# Patient Record
Sex: Male | Born: 1955
Health system: Southern US, Community
[De-identification: ages and names within clinical notes are randomized; demographics above are authoritative.]

## PROBLEM LIST (undated history)

## (undated) DIAGNOSIS — J449 Chronic obstructive pulmonary disease, unspecified: Secondary | ICD-10-CM

---

## 2017-02-13 DIAGNOSIS — E785 Hyperlipidemia, unspecified: Secondary | ICD-10-CM | POA: Diagnosis not present

## 2017-02-13 DIAGNOSIS — Z0000 Encounter for general adult medical examination without abnormal findings: Secondary | ICD-10-CM | POA: Diagnosis not present

## 2017-02-21 DIAGNOSIS — H2513 Age-related nuclear cataract, bilateral: Secondary | ICD-10-CM | POA: Diagnosis not present

## 2017-02-21 DIAGNOSIS — H353112 Nonexudative age-related macular degeneration, right eye, intermediate dry stage: Secondary | ICD-10-CM | POA: Diagnosis not present

## 2017-02-21 DIAGNOSIS — H353123 Nonexudative age-related macular degeneration, left eye, advanced atrophic without subfoveal involvement: Secondary | ICD-10-CM | POA: Diagnosis not present

## 2018-02-19 DIAGNOSIS — Z0000 Encounter for general adult medical examination without abnormal findings: Secondary | ICD-10-CM | POA: Diagnosis not present

## 2018-02-19 DIAGNOSIS — E785 Hyperlipidemia, unspecified: Secondary | ICD-10-CM | POA: Diagnosis not present

## 2018-03-15 DIAGNOSIS — H353112 Nonexudative age-related macular degeneration, right eye, intermediate dry stage: Secondary | ICD-10-CM | POA: Diagnosis not present

## 2018-03-15 DIAGNOSIS — H353123 Nonexudative age-related macular degeneration, left eye, advanced atrophic without subfoveal involvement: Secondary | ICD-10-CM | POA: Diagnosis not present

## 2018-07-15 DIAGNOSIS — H9072 Mixed conductive and sensorineural hearing loss, unilateral, left ear, with unrestricted hearing on the contralateral side: Secondary | ICD-10-CM | POA: Diagnosis not present

## 2018-07-15 DIAGNOSIS — H65492 Other chronic nonsuppurative otitis media, left ear: Secondary | ICD-10-CM | POA: Diagnosis not present

## 2018-07-15 DIAGNOSIS — H90A32 Mixed conductive and sensorineural hearing loss, unilateral, left ear with restricted hearing on the contralateral side: Secondary | ICD-10-CM | POA: Diagnosis not present

## 2018-07-15 DIAGNOSIS — H9191 Unspecified hearing loss, right ear: Secondary | ICD-10-CM | POA: Diagnosis not present

## 2018-07-15 DIAGNOSIS — H9312 Tinnitus, left ear: Secondary | ICD-10-CM | POA: Diagnosis not present

## 2018-07-22 DIAGNOSIS — Z87891 Personal history of nicotine dependence: Secondary | ICD-10-CM | POA: Diagnosis not present

## 2018-07-22 DIAGNOSIS — H6982 Other specified disorders of Eustachian tube, left ear: Secondary | ICD-10-CM | POA: Diagnosis not present

## 2018-07-22 DIAGNOSIS — H65492 Other chronic nonsuppurative otitis media, left ear: Secondary | ICD-10-CM | POA: Diagnosis not present

## 2018-08-19 DIAGNOSIS — H6983 Other specified disorders of Eustachian tube, bilateral: Secondary | ICD-10-CM | POA: Diagnosis not present

## 2018-12-05 DIAGNOSIS — H353123 Nonexudative age-related macular degeneration, left eye, advanced atrophic without subfoveal involvement: Secondary | ICD-10-CM | POA: Diagnosis not present

## 2018-12-05 DIAGNOSIS — H353113 Nonexudative age-related macular degeneration, right eye, advanced atrophic without subfoveal involvement: Secondary | ICD-10-CM | POA: Diagnosis not present

## 2019-03-28 DIAGNOSIS — E785 Hyperlipidemia, unspecified: Secondary | ICD-10-CM | POA: Diagnosis not present

## 2019-03-28 DIAGNOSIS — Z5181 Encounter for therapeutic drug level monitoring: Secondary | ICD-10-CM | POA: Diagnosis not present

## 2019-03-28 DIAGNOSIS — R739 Hyperglycemia, unspecified: Secondary | ICD-10-CM | POA: Diagnosis not present

## 2019-03-28 DIAGNOSIS — Z125 Encounter for screening for malignant neoplasm of prostate: Secondary | ICD-10-CM | POA: Diagnosis not present

## 2019-04-02 DIAGNOSIS — Z0000 Encounter for general adult medical examination without abnormal findings: Secondary | ICD-10-CM | POA: Diagnosis not present

## 2019-04-04 DIAGNOSIS — Z23 Encounter for immunization: Secondary | ICD-10-CM | POA: Diagnosis not present

## 2019-06-05 DIAGNOSIS — H524 Presbyopia: Secondary | ICD-10-CM | POA: Diagnosis not present

## 2019-06-05 DIAGNOSIS — H2513 Age-related nuclear cataract, bilateral: Secondary | ICD-10-CM | POA: Diagnosis not present

## 2019-06-05 DIAGNOSIS — H353113 Nonexudative age-related macular degeneration, right eye, advanced atrophic without subfoveal involvement: Secondary | ICD-10-CM | POA: Diagnosis not present

## 2019-06-05 DIAGNOSIS — H5213 Myopia, bilateral: Secondary | ICD-10-CM | POA: Diagnosis not present

## 2019-06-05 DIAGNOSIS — H353123 Nonexudative age-related macular degeneration, left eye, advanced atrophic without subfoveal involvement: Secondary | ICD-10-CM | POA: Diagnosis not present

## 2019-06-12 DIAGNOSIS — Z23 Encounter for immunization: Secondary | ICD-10-CM | POA: Diagnosis not present

## 2019-07-29 DIAGNOSIS — Z23 Encounter for immunization: Secondary | ICD-10-CM | POA: Diagnosis not present

## 2019-09-29 DIAGNOSIS — H353113 Nonexudative age-related macular degeneration, right eye, advanced atrophic without subfoveal involvement: Secondary | ICD-10-CM | POA: Diagnosis not present

## 2019-09-29 DIAGNOSIS — H353221 Exudative age-related macular degeneration, left eye, with active choroidal neovascularization: Secondary | ICD-10-CM | POA: Diagnosis not present

## 2019-10-15 DIAGNOSIS — H353221 Exudative age-related macular degeneration, left eye, with active choroidal neovascularization: Secondary | ICD-10-CM | POA: Diagnosis not present

## 2019-10-15 DIAGNOSIS — H353112 Nonexudative age-related macular degeneration, right eye, intermediate dry stage: Secondary | ICD-10-CM | POA: Diagnosis not present

## 2019-10-15 DIAGNOSIS — H43821 Vitreomacular adhesion, right eye: Secondary | ICD-10-CM | POA: Diagnosis not present

## 2019-11-12 DIAGNOSIS — H353221 Exudative age-related macular degeneration, left eye, with active choroidal neovascularization: Secondary | ICD-10-CM | POA: Diagnosis not present

## 2019-11-12 DIAGNOSIS — H353112 Nonexudative age-related macular degeneration, right eye, intermediate dry stage: Secondary | ICD-10-CM | POA: Diagnosis not present

## 2019-11-12 DIAGNOSIS — H43821 Vitreomacular adhesion, right eye: Secondary | ICD-10-CM | POA: Diagnosis not present

## 2019-12-12 DIAGNOSIS — H353112 Nonexudative age-related macular degeneration, right eye, intermediate dry stage: Secondary | ICD-10-CM | POA: Diagnosis not present

## 2019-12-12 DIAGNOSIS — H353221 Exudative age-related macular degeneration, left eye, with active choroidal neovascularization: Secondary | ICD-10-CM | POA: Diagnosis not present

## 2019-12-12 DIAGNOSIS — H43823 Vitreomacular adhesion, bilateral: Secondary | ICD-10-CM | POA: Diagnosis not present

## 2020-01-29 DIAGNOSIS — U071 COVID-19: Secondary | ICD-10-CM | POA: Diagnosis not present

## 2020-01-30 DIAGNOSIS — R509 Fever, unspecified: Secondary | ICD-10-CM | POA: Diagnosis not present

## 2020-01-30 DIAGNOSIS — Z1159 Encounter for screening for other viral diseases: Secondary | ICD-10-CM | POA: Diagnosis not present

## 2020-02-08 ENCOUNTER — Other Ambulatory Visit: Payer: Self-pay

## 2020-02-08 ENCOUNTER — Emergency Department (HOSPITAL_COMMUNITY): Payer: BC Managed Care – PPO

## 2020-02-08 ENCOUNTER — Encounter (HOSPITAL_COMMUNITY): Payer: Self-pay

## 2020-02-08 ENCOUNTER — Inpatient Hospital Stay (HOSPITAL_COMMUNITY)
Admission: EM | Admit: 2020-02-08 | Discharge: 2020-03-06 | DRG: 870 | Disposition: E | Payer: BC Managed Care – PPO | Attending: Critical Care Medicine | Admitting: Critical Care Medicine

## 2020-02-08 DIAGNOSIS — A419 Sepsis, unspecified organism: Secondary | ICD-10-CM | POA: Diagnosis not present

## 2020-02-08 DIAGNOSIS — Z978 Presence of other specified devices: Secondary | ICD-10-CM

## 2020-02-08 DIAGNOSIS — A4189 Other specified sepsis: Principal | ICD-10-CM | POA: Diagnosis present

## 2020-02-08 DIAGNOSIS — K5900 Constipation, unspecified: Secondary | ICD-10-CM | POA: Diagnosis not present

## 2020-02-08 DIAGNOSIS — Z87891 Personal history of nicotine dependence: Secondary | ICD-10-CM | POA: Diagnosis not present

## 2020-02-08 DIAGNOSIS — Z9911 Dependence on respirator [ventilator] status: Secondary | ICD-10-CM | POA: Diagnosis not present

## 2020-02-08 DIAGNOSIS — R230 Cyanosis: Secondary | ICD-10-CM | POA: Diagnosis present

## 2020-02-08 DIAGNOSIS — E870 Hyperosmolality and hypernatremia: Secondary | ICD-10-CM | POA: Diagnosis not present

## 2020-02-08 DIAGNOSIS — Z515 Encounter for palliative care: Secondary | ICD-10-CM | POA: Diagnosis not present

## 2020-02-08 DIAGNOSIS — U071 COVID-19: Secondary | ICD-10-CM | POA: Diagnosis present

## 2020-02-08 DIAGNOSIS — R0689 Other abnormalities of breathing: Secondary | ICD-10-CM

## 2020-02-08 DIAGNOSIS — Z4682 Encounter for fitting and adjustment of non-vascular catheter: Secondary | ICD-10-CM | POA: Diagnosis not present

## 2020-02-08 DIAGNOSIS — E875 Hyperkalemia: Secondary | ICD-10-CM | POA: Diagnosis not present

## 2020-02-08 DIAGNOSIS — E781 Pure hyperglyceridemia: Secondary | ICD-10-CM | POA: Diagnosis present

## 2020-02-08 DIAGNOSIS — J1282 Pneumonia due to coronavirus disease 2019: Secondary | ICD-10-CM | POA: Diagnosis present

## 2020-02-08 DIAGNOSIS — R7303 Prediabetes: Secondary | ICD-10-CM | POA: Diagnosis not present

## 2020-02-08 DIAGNOSIS — R0602 Shortness of breath: Secondary | ICD-10-CM | POA: Diagnosis not present

## 2020-02-08 DIAGNOSIS — J181 Lobar pneumonia, unspecified organism: Secondary | ICD-10-CM | POA: Diagnosis not present

## 2020-02-08 DIAGNOSIS — N170 Acute kidney failure with tubular necrosis: Secondary | ICD-10-CM | POA: Diagnosis present

## 2020-02-08 DIAGNOSIS — J440 Chronic obstructive pulmonary disease with acute lower respiratory infection: Secondary | ICD-10-CM | POA: Diagnosis present

## 2020-02-08 DIAGNOSIS — Z9289 Personal history of other medical treatment: Secondary | ICD-10-CM

## 2020-02-08 DIAGNOSIS — E869 Volume depletion, unspecified: Secondary | ICD-10-CM | POA: Diagnosis present

## 2020-02-08 DIAGNOSIS — J9690 Respiratory failure, unspecified, unspecified whether with hypoxia or hypercapnia: Secondary | ICD-10-CM | POA: Diagnosis not present

## 2020-02-08 DIAGNOSIS — Z66 Do not resuscitate: Secondary | ICD-10-CM | POA: Diagnosis not present

## 2020-02-08 DIAGNOSIS — I82441 Acute embolism and thrombosis of right tibial vein: Secondary | ICD-10-CM | POA: Diagnosis not present

## 2020-02-08 DIAGNOSIS — R680 Hypothermia, not associated with low environmental temperature: Secondary | ICD-10-CM | POA: Diagnosis not present

## 2020-02-08 DIAGNOSIS — E669 Obesity, unspecified: Secondary | ICD-10-CM | POA: Diagnosis present

## 2020-02-08 DIAGNOSIS — J80 Acute respiratory distress syndrome: Secondary | ICD-10-CM | POA: Diagnosis not present

## 2020-02-08 DIAGNOSIS — R0603 Acute respiratory distress: Secondary | ICD-10-CM | POA: Diagnosis not present

## 2020-02-08 DIAGNOSIS — R600 Localized edema: Secondary | ICD-10-CM | POA: Diagnosis not present

## 2020-02-08 DIAGNOSIS — N179 Acute kidney failure, unspecified: Secondary | ICD-10-CM | POA: Diagnosis not present

## 2020-02-08 DIAGNOSIS — R0902 Hypoxemia: Secondary | ICD-10-CM | POA: Diagnosis not present

## 2020-02-08 DIAGNOSIS — E873 Alkalosis: Secondary | ICD-10-CM | POA: Diagnosis not present

## 2020-02-08 DIAGNOSIS — Z6837 Body mass index (BMI) 37.0-37.9, adult: Secondary | ICD-10-CM

## 2020-02-08 DIAGNOSIS — G9341 Metabolic encephalopathy: Secondary | ICD-10-CM | POA: Diagnosis not present

## 2020-02-08 DIAGNOSIS — J9600 Acute respiratory failure, unspecified whether with hypoxia or hypercapnia: Secondary | ICD-10-CM

## 2020-02-08 DIAGNOSIS — I82452 Acute embolism and thrombosis of left peroneal vein: Secondary | ICD-10-CM | POA: Diagnosis not present

## 2020-02-08 DIAGNOSIS — J9602 Acute respiratory failure with hypercapnia: Secondary | ICD-10-CM | POA: Diagnosis not present

## 2020-02-08 DIAGNOSIS — J9601 Acute respiratory failure with hypoxia: Secondary | ICD-10-CM | POA: Diagnosis not present

## 2020-02-08 DIAGNOSIS — L89816 Pressure-induced deep tissue damage of head: Secondary | ICD-10-CM | POA: Diagnosis not present

## 2020-02-08 DIAGNOSIS — R918 Other nonspecific abnormal finding of lung field: Secondary | ICD-10-CM | POA: Diagnosis not present

## 2020-02-08 DIAGNOSIS — R7401 Elevation of levels of liver transaminase levels: Secondary | ICD-10-CM | POA: Diagnosis present

## 2020-02-08 DIAGNOSIS — Z4659 Encounter for fitting and adjustment of other gastrointestinal appliance and device: Secondary | ICD-10-CM

## 2020-02-08 HISTORY — DX: Chronic obstructive pulmonary disease, unspecified: J44.9

## 2020-02-08 LAB — CBC WITH DIFFERENTIAL/PLATELET
Abs Immature Granulocytes: 2.38 10*3/uL — ABNORMAL HIGH (ref 0.00–0.07)
Basophils Absolute: 0.1 10*3/uL (ref 0.0–0.1)
Basophils Relative: 0 %
Eosinophils Absolute: 0 10*3/uL (ref 0.0–0.5)
Eosinophils Relative: 0 %
HCT: 48.2 % (ref 39.0–52.0)
Hemoglobin: 15.5 g/dL (ref 13.0–17.0)
Immature Granulocytes: 6 %
Lymphocytes Relative: 7 %
Lymphs Abs: 2.8 10*3/uL (ref 0.7–4.0)
MCH: 31.4 pg (ref 26.0–34.0)
MCHC: 32.2 g/dL (ref 30.0–36.0)
MCV: 97.8 fL (ref 80.0–100.0)
Monocytes Absolute: 1.2 10*3/uL — ABNORMAL HIGH (ref 0.1–1.0)
Monocytes Relative: 3 %
Neutro Abs: 32.6 10*3/uL — ABNORMAL HIGH (ref 1.7–7.7)
Neutrophils Relative %: 84 %
Platelets: 399 10*3/uL (ref 150–400)
RBC: 4.93 MIL/uL (ref 4.22–5.81)
RDW: 14.5 % (ref 11.5–15.5)
WBC: 39 10*3/uL — ABNORMAL HIGH (ref 4.0–10.5)
nRBC: 3 % — ABNORMAL HIGH (ref 0.0–0.2)

## 2020-02-08 LAB — C-REACTIVE PROTEIN: CRP: 22.7 mg/dL — ABNORMAL HIGH (ref <1.0)

## 2020-02-08 LAB — POCT I-STAT 7, (LYTES, BLD GAS, ICA,H+H)
Acid-base deficit: 3 mmol/L — ABNORMAL HIGH (ref 0.0–2.0)
Acid-base deficit: 3 mmol/L — ABNORMAL HIGH (ref 0.0–2.0)
Acid-base deficit: 3 mmol/L — ABNORMAL HIGH (ref 0.0–2.0)
Bicarbonate: 19.3 mmol/L — ABNORMAL LOW (ref 20.0–28.0)
Bicarbonate: 22.8 mmol/L (ref 20.0–28.0)
Bicarbonate: 21.4 mmol/L (ref 20.0–28.0)
Calcium, Ion: 1.08 mmol/L — ABNORMAL LOW (ref 1.15–1.40)
Calcium, Ion: 1.11 mmol/L — ABNORMAL LOW (ref 1.15–1.40)
Calcium, Ion: 1.07 mmol/L — ABNORMAL LOW (ref 1.15–1.40)
HCT: 42 % (ref 39.0–52.0)
HCT: 41 % (ref 39.0–52.0)
HCT: 36 % — ABNORMAL LOW (ref 39.0–52.0)
Hemoglobin: 14.3 g/dL (ref 13.0–17.0)
Hemoglobin: 13.9 g/dL (ref 13.0–17.0)
Hemoglobin: 12.2 g/dL — ABNORMAL LOW (ref 13.0–17.0)
O2 Saturation: 68 %
O2 Saturation: 81 %
O2 Saturation: 88 %
Patient temperature: 98.4
Patient temperature: 98.1
Patient temperature: 98.6
Potassium: 4.1 mmol/L (ref 3.5–5.1)
Potassium: 4.2 mmol/L (ref 3.5–5.1)
Potassium: 4.6 mmol/L (ref 3.5–5.1)
Sodium: 139 mmol/L (ref 135–145)
Sodium: 137 mmol/L (ref 135–145)
Sodium: 137 mmol/L (ref 135–145)
TCO2: 20 mmol/L — ABNORMAL LOW (ref 22–32)
TCO2: 24 mmol/L (ref 22–32)
TCO2: 22 mmol/L (ref 22–32)
pCO2 arterial: 26.8 mmHg — ABNORMAL LOW (ref 32.0–48.0)
pCO2 arterial: 41.1 mmHg (ref 32.0–48.0)
pCO2 arterial: 34.4 mmHg (ref 32.0–48.0)
pH, Arterial: 7.466 — ABNORMAL HIGH (ref 7.350–7.450)
pH, Arterial: 7.35 (ref 7.350–7.450)
pH, Arterial: 7.403 (ref 7.350–7.450)
pO2, Arterial: 32 mmHg — CL (ref 83.0–108.0)
pO2, Arterial: 47 mmHg — ABNORMAL LOW (ref 83.0–108.0)
pO2, Arterial: 54 mmHg — ABNORMAL LOW (ref 83.0–108.0)

## 2020-02-08 LAB — CBC
HCT: 37.4 % — ABNORMAL LOW (ref 39.0–52.0)
Hemoglobin: 12.3 g/dL — ABNORMAL LOW (ref 13.0–17.0)
MCH: 32.1 pg (ref 26.0–34.0)
MCHC: 32.9 g/dL (ref 30.0–36.0)
MCV: 97.7 fL (ref 80.0–100.0)
Platelets: 293 10*3/uL (ref 150–400)
RBC: 3.83 MIL/uL — ABNORMAL LOW (ref 4.22–5.81)
RDW: 14.3 % (ref 11.5–15.5)
WBC: 30.8 10*3/uL — ABNORMAL HIGH (ref 4.0–10.5)
nRBC: 2 % — ABNORMAL HIGH (ref 0.0–0.2)

## 2020-02-08 LAB — FERRITIN: Ferritin: 766 ng/mL — ABNORMAL HIGH (ref 24–336)

## 2020-02-08 LAB — COMPREHENSIVE METABOLIC PANEL
ALT: 150 U/L — ABNORMAL HIGH (ref 0–44)
AST: 120 U/L — ABNORMAL HIGH (ref 15–41)
Albumin: 2.5 g/dL — ABNORMAL LOW (ref 3.5–5.0)
Alkaline Phosphatase: 144 U/L — ABNORMAL HIGH (ref 38–126)
Anion gap: 16 — ABNORMAL HIGH (ref 5–15)
BUN: 40 mg/dL — ABNORMAL HIGH (ref 8–23)
CO2: 18 mmol/L — ABNORMAL LOW (ref 22–32)
Calcium: 7.8 mg/dL — ABNORMAL LOW (ref 8.9–10.3)
Chloride: 104 mmol/L (ref 98–111)
Creatinine, Ser: 1.46 mg/dL — ABNORMAL HIGH (ref 0.61–1.24)
GFR calc Af Amer: 58 mL/min — ABNORMAL LOW (ref >60)
GFR calc non Af Amer: 50 mL/min — ABNORMAL LOW (ref >60)
Glucose, Bld: 139 mg/dL — ABNORMAL HIGH (ref 70–99)
Potassium: 5.1 mmol/L (ref 3.5–5.1)
Sodium: 138 mmol/L (ref 135–145)
Total Bilirubin: 1.5 mg/dL — ABNORMAL HIGH (ref 0.3–1.2)
Total Protein: 7 g/dL (ref 6.5–8.1)

## 2020-02-08 LAB — TRIGLYCERIDES: Triglycerides: 218 mg/dL — ABNORMAL HIGH (ref <150)

## 2020-02-08 LAB — FIBRINOGEN: Fibrinogen: 284 mg/dL (ref 210–475)

## 2020-02-08 LAB — PROTIME-INR
INR: 1.5 — ABNORMAL HIGH (ref 0.8–1.2)
Prothrombin Time: 18.1 seconds — ABNORMAL HIGH (ref 11.4–15.2)

## 2020-02-08 LAB — POC SARS CORONAVIRUS 2 AG -  ED: SARS Coronavirus 2 Ag: NEGATIVE

## 2020-02-08 LAB — LACTATE DEHYDROGENASE: LDH: 1025 U/L — ABNORMAL HIGH (ref 98–192)

## 2020-02-08 LAB — PROCALCITONIN: Procalcitonin: 0.51 ng/mL

## 2020-02-08 LAB — ABO/RH: ABO/RH(D): A NEG

## 2020-02-08 LAB — D-DIMER, QUANTITATIVE: D-Dimer, Quant: >20 ug/mL-FEU — ABNORMAL HIGH (ref 0.00–0.50)

## 2020-02-08 MED ORDER — FENTANYL CITRATE (PF) 100 MCG/2ML IJ SOLN
INTRAMUSCULAR | Status: AC
  Administered 2020-02-08: 100 ug
  Filled 2020-02-08: qty 2

## 2020-02-08 MED ORDER — SODIUM CHLORIDE 0.9 % IV SOLN
1.0000 g | Freq: Once | INTRAVENOUS | Status: AC
  Administered 2020-02-08: 1 g via INTRAVENOUS
  Filled 2020-02-08: qty 10

## 2020-02-08 MED ORDER — LACTATED RINGERS IV BOLUS
500.0000 mL | Freq: Once | INTRAVENOUS | Status: AC
  Administered 2020-02-08: 500 mL via INTRAVENOUS

## 2020-02-08 MED ORDER — PROPOFOL 1000 MG/100ML IV EMUL
5.0000 ug/kg/min | INTRAVENOUS | Status: DC
  Administered 2020-02-08: 60 ug/kg/min via INTRAVENOUS
  Filled 2020-02-08: qty 100

## 2020-02-08 MED ORDER — PANTOPRAZOLE SODIUM 40 MG IV SOLR
40.0000 mg | Freq: Every day | INTRAVENOUS | Status: DC
  Administered 2020-02-08 – 2020-02-09 (×2): 40 mg via INTRAVENOUS
  Filled 2020-02-08 (×2): qty 40

## 2020-02-08 MED ORDER — CHLORHEXIDINE GLUCONATE 0.12% ORAL RINSE (MEDLINE KIT)
15.0000 mL | Freq: Two times a day (BID) | OROMUCOSAL | Status: DC
  Administered 2020-02-09 – 2020-02-24 (×31): 15 mL via OROMUCOSAL

## 2020-02-08 MED ORDER — TOCILIZUMAB 400 MG/20ML IV SOLN
4.0000 mg/kg | Freq: Once | INTRAVENOUS | Status: AC
  Administered 2020-02-09: 440 mg via INTRAVENOUS
  Filled 2020-02-08: qty 10
  Filled 2020-02-08: qty 22

## 2020-02-08 MED ORDER — PROPOFOL 1000 MG/100ML IV EMUL
INTRAVENOUS | Status: AC
  Administered 2020-02-08: 10 ug/kg/min via INTRAVENOUS
  Filled 2020-02-08: qty 100

## 2020-02-08 MED ORDER — FENTANYL CITRATE (PF) 100 MCG/2ML IJ SOLN
50.0000 ug | INTRAMUSCULAR | Status: AC | PRN
  Administered 2020-02-09 – 2020-02-11 (×3): 50 ug via INTRAVENOUS

## 2020-02-08 MED ORDER — SODIUM CHLORIDE 0.9 % IV SOLN
200.0000 mg | Freq: Once | INTRAVENOUS | Status: AC
  Administered 2020-02-08: 200 mg via INTRAVENOUS
  Filled 2020-02-08: qty 40

## 2020-02-08 MED ORDER — FENTANYL CITRATE (PF) 100 MCG/2ML IJ SOLN
50.0000 ug | INTRAMUSCULAR | Status: DC | PRN
  Administered 2020-02-08 (×2): 100 ug via INTRAVENOUS
  Filled 2020-02-08 (×2): qty 2

## 2020-02-08 MED ORDER — CISATRACURIUM BESYLATE 20 MG/10ML IV SOLN
0.1000 mg/kg | INTRAVENOUS | Status: DC | PRN
  Filled 2020-02-08 (×2): qty 10

## 2020-02-08 MED ORDER — ORAL CARE MOUTH RINSE
15.0000 mL | OROMUCOSAL | Status: DC
  Administered 2020-02-09 – 2020-02-24 (×154): 15 mL via OROMUCOSAL

## 2020-02-08 MED ORDER — FENTANYL BOLUS VIA INFUSION
50.0000 ug | INTRAVENOUS | Status: DC | PRN
  Administered 2020-02-09 – 2020-02-17 (×8): 50 ug via INTRAVENOUS
  Filled 2020-02-08: qty 50

## 2020-02-08 MED ORDER — FENTANYL 2500MCG IN NS 250ML (10MCG/ML) PREMIX INFUSION
0.0000 ug/h | INTRAVENOUS | Status: DC
  Administered 2020-02-08: 50 ug/h via INTRAVENOUS
  Administered 2020-02-09 (×2): 200 ug/h via INTRAVENOUS
  Administered 2020-02-10: 300 ug/h via INTRAVENOUS
  Administered 2020-02-10: 250 ug/h via INTRAVENOUS
  Administered 2020-02-11: 400 ug/h via INTRAVENOUS
  Administered 2020-02-11: 300 ug/h via INTRAVENOUS
  Administered 2020-02-11 – 2020-02-15 (×14): 400 ug/h via INTRAVENOUS
  Administered 2020-02-15: 350 ug/h via INTRAVENOUS
  Administered 2020-02-15: 400 ug/h via INTRAVENOUS
  Administered 2020-02-16: 350 ug/h via INTRAVENOUS
  Administered 2020-02-16 – 2020-02-17 (×4): 400 ug/h via INTRAVENOUS
  Filled 2020-02-08 (×29): qty 250

## 2020-02-08 MED ORDER — DEXAMETHASONE SODIUM PHOSPHATE 10 MG/ML IJ SOLN
6.0000 mg | INTRAMUSCULAR | Status: AC
  Administered 2020-02-09 – 2020-02-17 (×9): 6 mg via INTRAVENOUS
  Filled 2020-02-08 (×9): qty 1

## 2020-02-08 MED ORDER — PROPOFOL 1000 MG/100ML IV EMUL: 0.0000 ug/kg/min | INTRAVENOUS | Status: DC

## 2020-02-08 MED ORDER — SODIUM CHLORIDE 0.9 % IV SOLN
100.0000 mg | Freq: Every day | INTRAVENOUS | Status: AC
  Administered 2020-02-09 – 2020-02-12 (×4): 100 mg via INTRAVENOUS
  Filled 2020-02-08 (×5): qty 20

## 2020-02-08 MED ORDER — ROCURONIUM BROMIDE 50 MG/5ML IV SOLN
100.0000 mg | Freq: Once | INTRAVENOUS | Status: AC
  Administered 2020-02-09: 100 mg via INTRAVENOUS
  Filled 2020-02-08: qty 10

## 2020-02-08 MED ORDER — PROPOFOL 1000 MG/100ML IV EMUL
0.0000 ug/kg/min | INTRAVENOUS | Status: DC
  Administered 2020-02-08 – 2020-02-09 (×3): 50 ug/kg/min via INTRAVENOUS
  Administered 2020-02-09 (×2): 40 ug/kg/min via INTRAVENOUS
  Filled 2020-02-08 (×5): qty 100

## 2020-02-08 MED ORDER — SODIUM CHLORIDE 0.9 % IV SOLN
500.0000 mg | Freq: Once | INTRAVENOUS | Status: DC
  Administered 2020-02-08: 22:00:00 500 mg via INTRAVENOUS
  Filled 2020-02-08: qty 500

## 2020-02-08 MED ORDER — FENTANYL CITRATE (PF) 100 MCG/2ML IJ SOLN: 50.0000 ug | INTRAMUSCULAR | Status: DC | PRN

## 2020-02-08 MED ORDER — DEXAMETHASONE SODIUM PHOSPHATE 10 MG/ML IJ SOLN
6.0000 mg | Freq: Once | INTRAMUSCULAR | Status: AC
  Administered 2020-02-08: 6 mg via INTRAVENOUS
  Filled 2020-02-08: qty 1

## 2020-02-08 MED ORDER — INSULIN ASPART 100 UNIT/ML ~~LOC~~ SOLN
0.0000 [IU] | SUBCUTANEOUS | Status: DC
  Administered 2020-02-09 – 2020-02-10 (×7): 3 [IU] via SUBCUTANEOUS
  Administered 2020-02-10 (×2): 5 [IU] via SUBCUTANEOUS
  Administered 2020-02-10: 3 [IU] via SUBCUTANEOUS
  Administered 2020-02-10: 21:00:00 5 [IU] via SUBCUTANEOUS
  Administered 2020-02-10 – 2020-02-11 (×4): 3 [IU] via SUBCUTANEOUS
  Administered 2020-02-11: 2 [IU] via SUBCUTANEOUS
  Administered 2020-02-11 – 2020-02-12 (×4): 3 [IU] via SUBCUTANEOUS
  Administered 2020-02-12: 2 [IU] via SUBCUTANEOUS
  Administered 2020-02-12 (×2): 3 [IU] via SUBCUTANEOUS
  Administered 2020-02-12 – 2020-02-13 (×5): 2 [IU] via SUBCUTANEOUS
  Administered 2020-02-13: 3 [IU] via SUBCUTANEOUS
  Administered 2020-02-13: 2 [IU] via SUBCUTANEOUS
  Administered 2020-02-13: 3 [IU] via SUBCUTANEOUS
  Administered 2020-02-14: 2 [IU] via SUBCUTANEOUS
  Administered 2020-02-14: 3 [IU] via SUBCUTANEOUS
  Administered 2020-02-14: 2 [IU] via SUBCUTANEOUS
  Administered 2020-02-14 (×2): 3 [IU] via SUBCUTANEOUS
  Administered 2020-02-14 – 2020-02-15 (×2): 2 [IU] via SUBCUTANEOUS
  Administered 2020-02-15 (×3): 3 [IU] via SUBCUTANEOUS
  Administered 2020-02-16 (×3): 2 [IU] via SUBCUTANEOUS
  Administered 2020-02-16 (×3): 3 [IU] via SUBCUTANEOUS
  Administered 2020-02-17: 5 [IU] via SUBCUTANEOUS
  Administered 2020-02-17: 2 [IU] via SUBCUTANEOUS
  Administered 2020-02-17: 3 [IU] via SUBCUTANEOUS
  Administered 2020-02-17: 2 [IU] via SUBCUTANEOUS
  Administered 2020-02-18: 5 [IU] via SUBCUTANEOUS
  Administered 2020-02-18: 08:00:00 3 [IU] via SUBCUTANEOUS
  Administered 2020-02-18: 5 [IU] via SUBCUTANEOUS
  Administered 2020-02-18: 3 [IU] via SUBCUTANEOUS
  Administered 2020-02-18: 2 [IU] via SUBCUTANEOUS
  Administered 2020-02-18 – 2020-02-19 (×2): 3 [IU] via SUBCUTANEOUS
  Administered 2020-02-19: 2 [IU] via SUBCUTANEOUS
  Administered 2020-02-19 – 2020-02-20 (×6): 3 [IU] via SUBCUTANEOUS
  Administered 2020-02-20 (×2): 2 [IU] via SUBCUTANEOUS
  Administered 2020-02-21 (×6): 3 [IU] via SUBCUTANEOUS
  Administered 2020-02-22: 13:00:00 5 [IU] via SUBCUTANEOUS
  Administered 2020-02-22: 3 [IU] via SUBCUTANEOUS
  Administered 2020-02-22: 8 [IU] via SUBCUTANEOUS
  Administered 2020-02-22: 5 [IU] via SUBCUTANEOUS
  Administered 2020-02-22: 8 [IU] via SUBCUTANEOUS
  Administered 2020-02-22: 09:00:00 2 [IU] via SUBCUTANEOUS
  Administered 2020-02-22: 01:00:00 3 [IU] via SUBCUTANEOUS
  Administered 2020-02-23: 11 [IU] via SUBCUTANEOUS
  Administered 2020-02-23 (×2): 5 [IU] via SUBCUTANEOUS
  Administered 2020-02-23 (×2): 8 [IU] via SUBCUTANEOUS
  Administered 2020-02-23: 5 [IU] via SUBCUTANEOUS
  Administered 2020-02-24 (×2): 8 [IU] via SUBCUTANEOUS

## 2020-02-08 MED ORDER — ARTIFICIAL TEARS OPHTHALMIC OINT
1.0000 "application " | TOPICAL_OINTMENT | Freq: Three times a day (TID) | OPHTHALMIC | Status: DC
  Administered 2020-02-09 – 2020-02-16 (×21): 1 via OPHTHALMIC
  Filled 2020-02-08 (×4): qty 3.5

## 2020-02-08 MED ORDER — FENTANYL CITRATE (PF) 100 MCG/2ML IJ SOLN
50.0000 ug | Freq: Once | INTRAMUSCULAR | Status: AC
  Administered 2020-02-08: 50 ug via INTRAVENOUS
  Filled 2020-02-08: qty 2

## 2020-02-08 MED ORDER — ENOXAPARIN SODIUM 40 MG/0.4ML ~~LOC~~ SOLN
40.0000 mg | SUBCUTANEOUS | Status: DC
  Administered 2020-02-08: 40 mg via SUBCUTANEOUS
  Filled 2020-02-08: qty 0.4

## 2020-02-08 NOTE — ED Provider Notes (Addendum)
[facility] EMERGENCY DEPARTMENT Provider Note   CSN: [id number] Arrival date & time: 02/06/2020  1828     History Chief Complaint  Patient presents with  . sepsis/ low sats    Gregory Harrington is a 64 y.o. male.  HPI   Patient presents to the ED for evaluation of respiratory distress.  Patient was diagnosed with Covid on March 26.  Over the last few days he has had increasing shortness of breath.  Patient's had some headache as well as confusion.  EMS was called today when they arrived his oxygen saturation was in the 50s.  Patient states he has been coughing.  Denies any abdominal pain.  No focal numbness or weakness.  Past Medical History:  Diagnosis Date  . COPD (chronic obstructive pulmonary disease) (HCC)     There are no problems to display for this patient.   History reviewed. No pertinent surgical history.     No family history on file.  Social History   Tobacco Use  . Smoking status: Not on file  Substance Use Topics  . Alcohol use: Not on file  . Drug use: Not on file    Home Medications Prior to Admission medications   Not on File    Allergies    Patient has no known allergies.  Review of Systems   Review of Systems  Musculoskeletal: Positive for myalgias.  All other systems reviewed and are negative.   Physical Exam Updated Vital Signs BP 106/63   Pulse 66   Temp 98.2 F (36.8 C) (Oral)   Resp 20   Ht 1.702 m (5\' 7" )   Wt 110 kg   SpO2 (!) 72%   BMI 37.98 kg/m   Physical Exam Vitals and nursing note reviewed.  Constitutional:      General: He is in acute distress.     Appearance: He is well-developed. He is ill-appearing.  HENT:     Head: Normocephalic and atraumatic.     Right Ear: External ear normal.     Left Ear: External ear normal.  Eyes:     General: No scleral icterus.       Right eye: No discharge.        Left eye: No discharge.     Conjunctiva/sclera: Conjunctivae normal.  Neck:     Trachea: No tracheal  deviation.  Cardiovascular:     Rate and Rhythm: Normal rate and regular rhythm.  Pulmonary:     Effort: Tachypnea and respiratory distress present.     Breath sounds: No stridor. Rales present. No wheezing.  Abdominal:     General: Bowel sounds are normal. There is no distension.     Palpations: Abdomen is soft.     Tenderness: There is no abdominal tenderness. There is no guarding or rebound.  Musculoskeletal:        General: No tenderness.     Cervical back: Neck supple.     Right lower leg: No edema.     Left lower leg: No edema.  Skin:    General: Skin is warm and dry.     Findings: No rash.  Neurological:     GCS: GCS eye subscore is 4. GCS verbal subscore is 4. GCS motor subscore is 6.     Cranial Nerves: No cranial nerve deficit (no facial droop, extraocular movements intact, no slurred speech).     Sensory: No sensory deficit.     Motor: No abnormal muscle tone or seizure activity.  Coordination: Coordination normal.     Comments: Answering questions, somewhat slow to respond     ED Results / Procedures / Treatments   Labs (all labs ordered are listed, but only abnormal results are displayed) Labs Reviewed  CBC WITH DIFFERENTIAL/PLATELET - Abnormal; Notable for the following components:      Result Value   WBC 39.0 (*)    nRBC 3.0 (*)    Neutro Abs 32.6 (*)    Monocytes Absolute 1.2 (*)    Abs Immature Granulocytes 2.38 (*)    All other components within normal limits  COMPREHENSIVE METABOLIC PANEL - Abnormal; Notable for the following components:   CO2 18 (*)    Glucose, Bld 139 (*)    BUN 40 (*)    Creatinine, Ser 1.46 (*)    Calcium 7.8 (*)    Albumin 2.5 (*)    AST 120 (*)    ALT 150 (*)    Alkaline Phosphatase 144 (*)    Total Bilirubin 1.5 (*)    GFR calc non Af Amer 50 (*)    GFR calc Af Amer 58 (*)    Anion gap 16 (*)    All other components within normal limits  LACTATE DEHYDROGENASE - Abnormal; Notable for the following components:   LDH  1,025 (*)    All other components within normal limits  FERRITIN - Abnormal; Notable for the following components:   Ferritin 766 (*)    All other components within normal limits  C-REACTIVE PROTEIN - Abnormal; Notable for the following components:   CRP 22.7 (*)    All other components within normal limits  TRIGLYCERIDES - Abnormal; Notable for the following components:   Triglycerides 218 (*)    All other components within normal limits  POCT I-STAT 7, (LYTES, BLD GAS, ICA,H+H) - Abnormal; Notable for the following components:   pH, Arterial 7.466 (*)    pCO2 arterial 26.8 (*)    pO2, Arterial 32.0 (*)    Bicarbonate 19.3 (*)    TCO2 20 (*)    Acid-base deficit 3.0 (*)    Calcium, Ion 1.08 (*)    All other components within normal limits  POCT I-STAT 7, (LYTES, BLD GAS, ICA,H+H) - Abnormal; Notable for the following components:   pO2, Arterial 47.0 (*)    Acid-base deficit 3.0 (*)    Calcium, Ion 1.11 (*)    All other components within normal limits  CULTURE, BLOOD (ROUTINE X 2)  CULTURE, BLOOD (ROUTINE X 2)  PROCALCITONIN  LACTIC ACID, PLASMA  LACTIC ACID, PLASMA  TRIGLYCERIDES  D-DIMER, QUANTITATIVE (NOT AT [facility])  FIBRINOGEN  PROTIME-INR  POC SARS CORONAVIRUS 2 AG -  ED  I-STAT ARTERIAL BLOOD GAS, ED    EKG EKG Interpretation  Date/Time:  Sunday February 08 2020 19:27:34 EDT Ventricular Rate:  88 PR Interval:    QRS Duration: 97 QT Interval:  481 QTC Calculation: 583 R Axis:     Text Interpretation: Sinus rhythm Prolonged QT interval No old tracing to compare Confirmed by Dorie Rank [phone]) on 02/27/2020 7:49:45 PM   Radiology DG Abdomen 1 View  Result Date: 02/23/2020 CLINICAL DATA:  OG tube placement. EXAM: ABDOMEN - 1 VIEW COMPARISON:  None. FINDINGS: OG tube is well positioned in the stomach. No dilated small bowel loops seen. Visualized osseous structures are unremarkable. IMPRESSION: OG tube is well positioned in the stomach. Electronically Signed   By: Franki Cabot M.D.   On: 02/27/2020 20:33   DG  Chest Port 1 View  Result Date: 02/29/2020 CLINICAL DATA:  Status post intubation. EXAM: PORTABLE CHEST 1 VIEW COMPARISON:  None. FINDINGS: Endotracheal tube appears well positioned with tip approximately 3 cm above the carina. Patchy airspace opacities bilaterally, consistent with multifocal pneumonia. No pneumothorax is seen. Heart size is within normal limits. IMPRESSION: 1. Endotracheal tube well positioned with tip approximately 3 cm above the carina. 2. Patchy airspace opacities bilaterally, consistent with multifocal pneumonia. Electronically Signed   By: Bary Richard M.D.   On: 02/19/2020 20:32    Procedures .Critical Care Performed by: Linwood Dibbles, MD Authorized by: Linwood Dibbles, MD   Critical care provider statement:    Critical care time (minutes):  45   Critical care was time spent personally by me on the following activities:  Discussions with consultants, evaluation of patient's response to treatment, examination of patient, ordering and performing treatments and interventions, ordering and review of laboratory studies, ordering and review of radiographic studies, pulse oximetry, re-evaluation of patient's condition, obtaining history from patient or surrogate and review of old charts Procedure Name: Intubation Date/Time: 03/02/2020 7:50 PM Performed by: Linwood Dibbles, MD Pre-anesthesia Checklist: Patient identified, Patient being monitored, Emergency Drugs available, Timeout performed and Suction available Oxygen Delivery Method: Non-rebreather mask Preoxygenation: Pre-oxygenation with 100% oxygen Induction Type: Rapid sequence Ventilation: Mask ventilation without difficulty Laryngoscope Size: Glidescope Grade View: Grade I Tube size: 8.0 mm Number of attempts: 1 Placement Confirmation: ETT inserted through vocal cords under direct vision,  CO2 detector and Breath sounds checked- equal and bilateral Secured at: 25 cm Dental Injury: Teeth  and Oropharynx as per pre-operative assessment       (including critical care time)  Medications Ordered in ED Medications  fentaNYL (SUBLIMAZE) injection 50 mcg (has no administration in time range)  fentaNYL (SUBLIMAZE) injection 50-200 mcg (100 mcg Intravenous Given 02/10/2020 1952)  propofol (DIPRIVAN) 1000 MG/100ML infusion (60 mcg/kg/min  110 kg Intravenous New Bag/Given 02/07/2020 1944)  remdesivir 200 mg in sodium chloride 0.9% 250 mL IVPB (200 mg Intravenous New Bag/Given 02/21/2020 2046)    Followed by  remdesivir 100 mg in sodium chloride 0.9 % 100 mL IVPB (has no administration in time range)  cefTRIAXone (ROCEPHIN) 1 g in sodium chloride 0.9 % 100 mL IVPB (has no administration in time range)  azithromycin (ZITHROMAX) 500 mg in sodium chloride 0.9 % 250 mL IVPB (has no administration in time range)  dexamethasone (DECADRON) injection 6 mg (6 mg Intravenous Given 02/07/2020 1930)  fentaNYL (SUBLIMAZE) 100 MCG/2ML injection (100 mcg  Given 03/01/2020 1921)    ED Course  I have reviewed the triage vital signs and the nursing notes.  Pertinent labs & imaging results that were available during my care of the patient were reviewed by me and considered in my medical decision making (see chart for details).  Clinical Course as of Feb 07 2110  Wynelle Link Feb 08, 2020  3875 Patient is seen severe respiratory distress.  Oxygen saturations in the mid 50s despite 100% nonrebreather.  Patient states he was diagnosed with Covid on the 26th.   [JK]  1949 Patient did not respond to supplemental oxygen.  He consented to intubation   [JK]  1950 Repeat ABG shows persistent hypoxia although somewhat improved.   [JK]  2008 Laboratory tests show elevation of his BUN and creatinine.  He also has elevated LFTs consistent with multiorgan system failure   [JK]  2028 Will cover for possible community-acquired pneumonia superinfection.   [JK]  2032 CXR  shows diffuse severe infilrates.  ?ARDS, diffuse pneumonia   [JK]    2035 ALT(!): 150 [JK]  2043 Spoke with wife and updated her on the patients status.  With his grave condition she can come in to see him   [JK]    Clinical Course User Index [JK] Linwood Dibbles, MD   MDM Rules/Calculators/A&P                      Patient presented to ED with severe shortness of breath.  Patient was diagnosed with Covid as an outpatient.  Those records are not available but his history is certainly consistent with.  Patient had severe hypoxia on arrival.  He did not respond to supplemental oxygen.  Patient was intubated.  Despite intubation in the 100% oxygen supplementation he continues to have oxygen saturation in the high 70s.  I have ordered Decadron and remdesivir.  Chest x-ray is currently pending.  I will consult with critical care.  Critical care contacted.  Continue with supportive care.  Pt still is oxygenating poorly despite his maximum support. Hold of fluid boluses with his severe lung disease. Prognosis is poor.     Final Clinical Impression(s) / ED Diagnoses Final diagnoses:  Pneumonia due to COVID-19 virus  Sepsis due to COVID-19 [facility]  ARDS (adult respiratory distress syndrome) (HCC)        Linwood Dibbles, MD 02/19/2020 2111

## 2020-02-08 NOTE — H&P (Signed)
NAME:  Gregory Harrington, MRN:  [id number], DOB:  11-02-1956, LOS: 0 ADMISSION DATE:  02/26/2020, CONSULTATION DATE:  02/07/2020 REFERRING MD: Lynelle Doctor , CHIEF COMPLAINT:  Hypoxia   Brief History   64 y.o. M with PMH of COPD who was diagnosed with Covid-19 on 3/26, presented with worsening hypoxia and multi-focal PNA requiring intubation.   History of present illness   64 y.o. M with PMH of COPD who was diagnosed with Covid-19 outpatient on 3/26 and treated with azithromycin and Prednisone.  He had increased dyspnea and fever to 102F over the last several days; earlier today, his wife noted blue nailbeds and called EMS.  In the ED, pt was severely hypoxic with sats 50%'s despite non-rebreather.  CXR with multifocal PNA, leukocytosis of 39k, creatinine 1.46, and mildly elevated transaminases. PCCM consulted for admission  Past Medical History   has a past medical history of COPD (chronic obstructive pulmonary disease) (HCC). Tobacco use (quit 2007)  Significant Hospital Events   4/4 Admit to PCCM and initiate proning   Consults:    Procedures:  4/4 ETT  Significant Diagnostic Tests:  4/4 CXR>>  Micro Data:  4/4 POC Covid-19>>negative 4/4 BCx2>>  Antimicrobials:  Azithromycin 4/4 only Ceftriaxone 4/4-   Interim history/subjective:  Pt remained difficult to oxygenate on the ventilator, decision made to prone and paralyxe  Objective   Blood pressure 105/64, pulse 64, temperature 98.2 F (36.8 C), temperature source Oral, resp. rate 20, height 5\' 7"  (1.702 m), weight 110 kg, SpO2 (!) 72 %.    Vent Mode: PRVC FiO2 (%):  [100 %] 100 % Set Rate:  [18 bmp] 18 bmp Vt Set:  [500 mL] 500 mL PEEP:  [10 cmH20] 10 cmH20  No intake or output data in the 24 hours ending 02/26/2020 2117 Filed Weights   02/07/2020 1912  Weight: 110 kg   General:  Obese M, intubated and sedated HEENT: MM pink/moist, ETT Neuro:  Sedated, still withdrawing to pain, PERRLA  CV: s1s2  rrr, no m/r/g PULM:  Rhonchi  bilaterally, on full ventilator support GI: soft, bsx4 active  Extremities: warm/dry, 1+ edema  Skin: no rashes or lesions  Resolved Hospital Problem list     Assessment & Plan:   Acute hypoxic respiratory failure secondary to Covid-19 PNA -Intubated with subsequent difficulty oxygenating P: -initiate paralytics and proning, repeat ABG overnight -Continue Dexamethasone and Remdesevir -One dose Actemra 4mg /kg dose due to mildly elevated transaminases -Significant leukocytosis of 39k likely exacerbated by recent prednisone, continue Ceftriaxone, completed a course of azithromycin outpatient --Maintain full vent support with SAT/SBT as tolerated -titrate Vent setting to maintain SpO2 greater than or equal to 90%. -HOB elevated 30 degrees. -Plateau pressures less than 30 cm H20.  -Follow chest x-ray, ABG prn.   -Bronchial hygiene and RT/bronchodilator protocol.    Acute Kidney Injury, likely acute -unclear baseline, creatinine 1.4  -likely pre-renal volume depletion in the setting of acute illness P: -no IVF given in the ED, give 500cc bolus of LR x1, avoid significant volume in the setting of Covid PNA -avoid nephrotoxins and follow BMP    AGMA -Gap 16, CO2 18 -lactic acid pending -pH 7.35  History of COPD  -continue Dexamethasone and scheduled nebs   Elevated Transaminases  -wife states he has had acute liver failure in the past during critical illness from appendicitis, but returned to baseline -no ETOH use -Discussed risk of worsening hepatic function with Actemra P: -reduced dose Actemra for one dose and repeat LFT's in  the AM  Best practice:  Diet: NPO Pain/Anxiety/Delirium protocol (if indicated): Fentanyl, Propofol VAP protocol (if indicated): HOB to 30 degrees, daily SBT DVT prophylaxis: Lovenox GI prophylaxis: Protonix Glucose control: SSI Mobility: bed rest Code Status: full code Family Communication: wife updated at the bedside Disposition:  ICU  Labs   CBC: Recent Labs  Lab 02/05/2020 1842 02/22/2020 1843 03/01/2020 1958  WBC 39.0*  --   --   NEUTROABS 32.6*  --   --   HGB 15.5 14.3 13.9  HCT 48.2 42.0 41.0  MCV 97.8  --   --   PLT 399  --   --     Basic Metabolic Panel: Recent Labs  Lab 02/09/2020 1842 02/11/2020 1843 03/01/2020 1958  NA 138 139 137  K 5.1 4.1 4.2  CL 104  --   --   CO2 18*  --   --   GLUCOSE 139*  --   --   BUN 40*  --   --   CREATININE 1.46*  --   --   CALCIUM 7.8*  --   --    GFR: Estimated Creatinine Clearance: 61.3 mL/min (A) (by C-G formula based on SCr of 1.46 mg/dL (H)). Recent Labs  Lab 02/26/2020 1842  PROCALCITON 0.51  WBC 39.0*    Liver Function Tests: Recent Labs  Lab 02/12/2020 1842  AST 120*  ALT 150*  ALKPHOS 144*  BILITOT 1.5*  PROT 7.0  ALBUMIN 2.5*   No results for input(s): LIPASE, AMYLASE in the last 168 hours. No results for input(s): AMMONIA in the last 168 hours.  ABG    Component Value Date/Time   PHART 7.350 03/01/2020 1958   PCO2ART 41.1 02/28/2020 1958   PO2ART 47.0 (L) 02/23/2020 1958   HCO3 22.8 02/09/2020 1958   TCO2 24 02/23/2020 1958   ACIDBASEDEF 3.0 (H) 02/07/2020 1958   O2SAT 81.0 02/28/2020 1958     Coagulation Profile: No results for input(s): INR, PROTIME in the last 168 hours.  Cardiac Enzymes: No results for input(s): CKTOTAL, CKMB, CKMBINDEX, TROPONINI in the last 168 hours.  HbA1C: No results found for: HGBA1C  CBG: No results for input(s): GLUCAP in the last 168 hours.  Review of Systems:   Unable to obtain secondary to intubation  Past Medical History  He,  has a past medical history of COPD (chronic obstructive pulmonary disease) ([employer]).   Surgical History   History reviewed. No pertinent surgical history.   Social History      Family History   His family history is not on file.   Allergies No Known Allergies   Home Medications  Prior to Admission medications   Not on File     Critical care time: 65  minutes    CRITICAL CARE Performed by: Otilio Carpen Tannya Gonet   Total critical care time: 65 minutes  Critical care time was exclusive of separately billable procedures and treating other patients.  Critical care was necessary to treat or prevent imminent or life-threatening deterioration.  Critical care was time spent personally by me on the following activities: development of treatment plan with patient and/or surrogate as well as nursing, discussions with consultants, evaluation of patient's response to treatment, examination of patient, obtaining history from patient or surrogate, ordering and performing treatments and interventions, ordering and review of laboratory studies, ordering and review of radiographic studies, pulse oximetry and re-evaluation of patient's condition.   Otilio Carpen Keshonda Monsour, PA-C

## 2020-02-08 NOTE — ED Triage Notes (Signed)
Patient arrived by [facility] complaining of headache and some confusion. Has had SOB x 2-3 days. Diagnosed with Covid 3/26. Patient alert and oriented on arrival, sats 50s on arrival. Patient speaking few words with questioning. MD and RT at bedside.

## 2020-02-08 NOTE — ED Notes (Signed)
Dr Lynelle Doctor informed pt POC covid test neg

## 2020-02-09 ENCOUNTER — Other Ambulatory Visit: Payer: Self-pay

## 2020-02-09 ENCOUNTER — Encounter (HOSPITAL_COMMUNITY): Payer: Self-pay | Admitting: Student

## 2020-02-09 ENCOUNTER — Other Ambulatory Visit (HOSPITAL_COMMUNITY): Payer: BC Managed Care – PPO

## 2020-02-09 DIAGNOSIS — A4189 Other specified sepsis: Secondary | ICD-10-CM

## 2020-02-09 DIAGNOSIS — J9601 Acute respiratory failure with hypoxia: Secondary | ICD-10-CM | POA: Diagnosis not present

## 2020-02-09 DIAGNOSIS — N179 Acute kidney failure, unspecified: Secondary | ICD-10-CM | POA: Diagnosis not present

## 2020-02-09 DIAGNOSIS — J9602 Acute respiratory failure with hypercapnia: Secondary | ICD-10-CM | POA: Diagnosis not present

## 2020-02-09 DIAGNOSIS — U071 COVID-19: Secondary | ICD-10-CM | POA: Diagnosis not present

## 2020-02-09 DIAGNOSIS — J80 Acute respiratory distress syndrome: Secondary | ICD-10-CM

## 2020-02-09 DIAGNOSIS — J1282 Pneumonia due to coronavirus disease 2019: Secondary | ICD-10-CM | POA: Diagnosis not present

## 2020-02-09 LAB — POCT I-STAT 7, (LYTES, BLD GAS, ICA,H+H)
Acid-base deficit: 5 mmol/L — ABNORMAL HIGH (ref 0.0–2.0)
Acid-base deficit: 5 mmol/L — ABNORMAL HIGH (ref 0.0–2.0)
Bicarbonate: 23.9 mmol/L (ref 20.0–28.0)
Bicarbonate: 27 mmol/L (ref 20.0–28.0)
Calcium, Ion: 1.11 mmol/L — ABNORMAL LOW (ref 1.15–1.40)
Calcium, Ion: 1.14 mmol/L — ABNORMAL LOW (ref 1.15–1.40)
HCT: 40 % (ref 39.0–52.0)
HCT: 43 % (ref 39.0–52.0)
Hemoglobin: 13.6 g/dL (ref 13.0–17.0)
Hemoglobin: 14.6 g/dL (ref 13.0–17.0)
O2 Saturation: 96 %
O2 Saturation: 98 %
Patient temperature: 97.6
Patient temperature: 98.6
Potassium: 4.7 mmol/L (ref 3.5–5.1)
Potassium: 6.4 mmol/L (ref 3.5–5.1)
Sodium: 140 mmol/L (ref 135–145)
Sodium: 142 mmol/L (ref 135–145)
TCO2: 26 mmol/L (ref 22–32)
TCO2: 30 mmol/L (ref 22–32)
pCO2 arterial: 60.6 mmHg — ABNORMAL HIGH (ref 32.0–48.0)
pCO2 arterial: 82.8 mmHg (ref 32.0–48.0)
pH, Arterial: 7.122 — CL (ref 7.350–7.450)
pH, Arterial: 7.2 — ABNORMAL LOW (ref 7.350–7.450)
pO2, Arterial: 109 mmHg — ABNORMAL HIGH (ref 83.0–108.0)
pO2, Arterial: 122 mmHg — ABNORMAL HIGH (ref 83.0–108.0)

## 2020-02-09 LAB — CBC
HCT: 44.6 % (ref 39.0–52.0)
Hemoglobin: 13.9 g/dL (ref 13.0–17.0)
MCH: 31.8 pg (ref 26.0–34.0)
MCHC: 31.2 g/dL (ref 30.0–36.0)
MCV: 102.1 fL — ABNORMAL HIGH (ref 80.0–100.0)
Platelets: 311 10*3/uL (ref 150–400)
RBC: 4.37 MIL/uL (ref 4.22–5.81)
RDW: 14.6 % (ref 11.5–15.5)
WBC: 42.5 10*3/uL — ABNORMAL HIGH (ref 4.0–10.5)
nRBC: 2.1 % — ABNORMAL HIGH (ref 0.0–0.2)

## 2020-02-09 LAB — GLUCOSE, CAPILLARY
Glucose-Capillary: 159 mg/dL — ABNORMAL HIGH (ref 70–99)
Glucose-Capillary: 174 mg/dL — ABNORMAL HIGH (ref 70–99)
Glucose-Capillary: 199 mg/dL — ABNORMAL HIGH (ref 70–99)
Glucose-Capillary: 192 mg/dL — ABNORMAL HIGH (ref 70–99)
Glucose-Capillary: 197 mg/dL — ABNORMAL HIGH (ref 70–99)
Glucose-Capillary: 198 mg/dL — ABNORMAL HIGH (ref 70–99)

## 2020-02-09 LAB — RESPIRATORY PANEL BY RT PCR (FLU A&B, COVID)
Influenza A by PCR: NEGATIVE
Influenza B by PCR: NEGATIVE
SARS Coronavirus 2 by RT PCR: POSITIVE — AB

## 2020-02-09 LAB — COMPREHENSIVE METABOLIC PANEL
ALT: 122 U/L — ABNORMAL HIGH (ref 0–44)
AST: 73 U/L — ABNORMAL HIGH (ref 15–41)
Albumin: 2.3 g/dL — ABNORMAL LOW (ref 3.5–5.0)
Alkaline Phosphatase: 133 U/L — ABNORMAL HIGH (ref 38–126)
Anion gap: 11 (ref 5–15)
BUN: 44 mg/dL — ABNORMAL HIGH (ref 8–23)
CO2: 24 mmol/L (ref 22–32)
Calcium: 7.5 mg/dL — ABNORMAL LOW (ref 8.9–10.3)
Chloride: 105 mmol/L (ref 98–111)
Creatinine, Ser: 1.5 mg/dL — ABNORMAL HIGH (ref 0.61–1.24)
GFR calc Af Amer: 57 mL/min — ABNORMAL LOW (ref >60)
GFR calc non Af Amer: 49 mL/min — ABNORMAL LOW (ref >60)
Glucose, Bld: 175 mg/dL — ABNORMAL HIGH (ref 70–99)
Potassium: 5.3 mmol/L — ABNORMAL HIGH (ref 3.5–5.1)
Sodium: 140 mmol/L (ref 135–145)
Total Bilirubin: 0.8 mg/dL (ref 0.3–1.2)
Total Protein: 6.3 g/dL — ABNORMAL LOW (ref 6.5–8.1)

## 2020-02-09 LAB — BLOOD CULTURE ID PANEL (REFLEXED)

## 2020-02-09 LAB — LACTIC ACID, PLASMA: Lactic Acid, Venous: 2.6 mmol/L (ref 0.5–1.9)

## 2020-02-09 LAB — CREATININE, SERUM
Creatinine, Ser: 1.38 mg/dL — ABNORMAL HIGH (ref 0.61–1.24)
GFR calc Af Amer: >60 mL/min (ref >60)
GFR calc non Af Amer: 54 mL/min — ABNORMAL LOW (ref >60)

## 2020-02-09 LAB — SODIUM, URINE, RANDOM: Sodium, Ur: 61 mmol/L

## 2020-02-09 LAB — MRSA PCR SCREENING: MRSA by PCR: NEGATIVE

## 2020-02-09 LAB — FERRITIN: Ferritin: 780 ng/mL — ABNORMAL HIGH (ref 24–336)

## 2020-02-09 LAB — MAGNESIUM: Magnesium: 3 mg/dL — ABNORMAL HIGH (ref 1.7–2.4)

## 2020-02-09 LAB — HIV ANTIBODY (ROUTINE TESTING W REFLEX): HIV Screen 4th Generation wRfx: NONREACTIVE

## 2020-02-09 LAB — CBG MONITORING, ED: Glucose-Capillary: 155 mg/dL — ABNORMAL HIGH (ref 70–99)

## 2020-02-09 LAB — TRIGLYCERIDES: Triglycerides: 540 mg/dL — ABNORMAL HIGH (ref <150)

## 2020-02-09 LAB — CREATININE, URINE, RANDOM: Creatinine, Urine: 135.73 mg/dL

## 2020-02-09 LAB — C-REACTIVE PROTEIN: CRP: 22.4 mg/dL — ABNORMAL HIGH (ref <1.0)

## 2020-02-09 LAB — BRAIN NATRIURETIC PEPTIDE: B Natriuretic Peptide: 185.2 pg/mL — ABNORMAL HIGH (ref 0.0–100.0)

## 2020-02-09 LAB — SARS CORONAVIRUS 2 (TAT 6-24 HRS): SARS Coronavirus 2: NEGATIVE

## 2020-02-09 LAB — PHOSPHORUS: Phosphorus: 7.8 mg/dL — ABNORMAL HIGH (ref 2.5–4.6)

## 2020-02-09 MED ORDER — ROCURONIUM BROMIDE 10 MG/ML (PF) SYRINGE
PREFILLED_SYRINGE | INTRAVENOUS | Status: AC
  Filled 2020-02-09: qty 10

## 2020-02-09 MED ORDER — SODIUM CHLORIDE 0.9 % IV SOLN: 0.5000 ug/kg/min | INTRAVENOUS | Status: DC

## 2020-02-09 MED ORDER — CISATRACURIUM BOLUS VIA INFUSION
0.1000 mg/kg | Freq: Once | INTRAVENOUS | Status: DC
  Filled 2020-02-09: qty 11

## 2020-02-09 MED ORDER — SODIUM CHLORIDE 0.9 % IV SOLN
0.0000 ug/kg/min | INTRAVENOUS | Status: DC
  Administered 2020-02-09: 3 ug/kg/min via INTRAVENOUS
  Administered 2020-02-09 – 2020-02-14 (×7): 1.5 ug/kg/min via INTRAVENOUS
  Filled 2020-02-09 (×11): qty 20

## 2020-02-09 MED ORDER — VANCOMYCIN HCL 1250 MG/250ML IV SOLN
1250.0000 mg | INTRAVENOUS | Status: DC
  Administered 2020-02-09: 1250 mg via INTRAVENOUS
  Filled 2020-02-09: qty 250

## 2020-02-09 MED ORDER — SODIUM CHLORIDE 0.9 % IV SOLN
1.0000 g | INTRAVENOUS | Status: DC
  Administered 2020-02-09: 1 g via INTRAVENOUS
  Filled 2020-02-09: qty 10

## 2020-02-09 MED ORDER — MIDAZOLAM HCL 2 MG/2ML IJ SOLN
2.0000 mg | INTRAMUSCULAR | Status: AC | PRN
  Administered 2020-02-10 – 2020-02-16 (×3): 2 mg via INTRAVENOUS

## 2020-02-09 MED ORDER — CHLORHEXIDINE GLUCONATE 0.12% ORAL RINSE (MEDLINE KIT): 15.0000 mL | Freq: Two times a day (BID) | OROMUCOSAL | Status: DC

## 2020-02-09 MED ORDER — VITAL HIGH PROTEIN PO LIQD
1000.0000 mL | ORAL | Status: DC
  Administered 2020-02-09 – 2020-02-17 (×10): 1000 mL

## 2020-02-09 MED ORDER — SODIUM CHLORIDE 0.9 % IV SOLN
INTRAVENOUS | Status: DC | PRN
  Administered 2020-02-09 – 2020-02-12 (×2): 250 mL via INTRAVENOUS
  Administered 2020-02-18 – 2020-02-20 (×2): 500 mL via INTRAVENOUS

## 2020-02-09 MED ORDER — PRO-STAT SUGAR FREE PO LIQD
60.0000 mL | Freq: Three times a day (TID) | ORAL | Status: DC
  Administered 2020-02-09 – 2020-02-24 (×45): 60 mL
  Filled 2020-02-09 (×46): qty 60

## 2020-02-09 MED ORDER — MIDAZOLAM HCL 2 MG/2ML IJ SOLN
2.0000 mg | INTRAMUSCULAR | Status: DC | PRN
  Administered 2020-02-09 – 2020-02-24 (×10): 2 mg via INTRAVENOUS
  Filled 2020-02-09 (×3): qty 2

## 2020-02-09 MED ORDER — ARTIFICIAL TEARS OPHTHALMIC OINT
1.0000 "application " | TOPICAL_OINTMENT | Freq: Three times a day (TID) | OPHTHALMIC | Status: DC
  Administered 2020-02-09 (×3): 1 via OPHTHALMIC

## 2020-02-09 MED ORDER — ORAL CARE MOUTH RINSE: 15.0000 mL | OROMUCOSAL | Status: DC

## 2020-02-09 MED ORDER — CHLORHEXIDINE GLUCONATE CLOTH 2 % EX PADS
6.0000 | MEDICATED_PAD | Freq: Every day | CUTANEOUS | Status: DC
  Administered 2020-02-09 – 2020-02-24 (×14): 6 via TOPICAL

## 2020-02-09 MED ORDER — MIDAZOLAM 50MG/50ML (1MG/ML) PREMIX INFUSION
2.0000 mg/h | INTRAVENOUS | Status: DC
  Administered 2020-02-09 – 2020-02-10 (×2): 2 mg/h via INTRAVENOUS
  Administered 2020-02-11: 4 mg/h via INTRAVENOUS
  Administered 2020-02-11: 2 mg/h via INTRAVENOUS
  Administered 2020-02-12 – 2020-02-15 (×7): 4 mg/h via INTRAVENOUS
  Administered 2020-02-15: 2.5 mg/h via INTRAVENOUS
  Administered 2020-02-16: 2 mg/h via INTRAVENOUS
  Administered 2020-02-17: 23:00:00 4 mg/h via INTRAVENOUS
  Administered 2020-02-17: 2 mg/h via INTRAVENOUS
  Administered 2020-02-18 (×2): 4 mg/h via INTRAVENOUS
  Administered 2020-02-19 – 2020-02-22 (×6): 3 mg/h via INTRAVENOUS
  Administered 2020-02-23 – 2020-02-24 (×2): 2 mg/h via INTRAVENOUS
  Filled 2020-02-09 (×24): qty 50

## 2020-02-09 MED ORDER — HEPARIN SODIUM (PORCINE) 10000 UNIT/ML IJ SOLN
7500.0000 [IU] | Freq: Three times a day (TID) | INTRAMUSCULAR | Status: DC
  Administered 2020-02-09 – 2020-02-11 (×6): 7500 [IU] via SUBCUTANEOUS
  Filled 2020-02-09 (×7): qty 1

## 2020-02-09 NOTE — Procedures (Signed)
Arterial Catheter Insertion Procedure Note Gregory Harrington [id number] Jan 08, 1956  Procedure: Insertion of Arterial Catheter  Indications: Blood pressure monitoring and Frequent blood sampling  Procedure Details Consent: Risks of procedure as well as the alternatives and risks of each were explained to the (patient/caregiver).  Consent for procedure obtained. Time Out: Verified patient identification, verified procedure, site/side was marked, verified correct patient position, special equipment/implants available, medications/allergies/relevent history reviewed, required imaging and test results available.  Performed  Maximum sterile technique was used including antiseptics, cap, gloves, gown, hand hygiene, mask and sheet. Skin prep: ChloraPrep; local anesthetic administered 22 gauge catheter was inserted into left radial artery using the Seldinger technique. ULTRASOUND GUIDANCE USED: YES Evaluation Blood flow good; BP tracing good. Complications: No apparent complications.   Gregory Harrington 02/09/2020

## 2020-02-09 NOTE — Progress Notes (Signed)
Patient turned back to supine position. No complications noted.

## 2020-02-09 NOTE — Progress Notes (Addendum)
eLink Physician-Brief Progress Note Patient Name: Gregory Harrington DOB: 12-Nov-1955 MRN: [id number]   Date of Service  02/09/2020  HPI/Events of Note  11 M history of COPD, COVID confirmed 3/26 prescribed azithromycin and prednisone presented with worsening SOB and fever 102. Hypoxic on presentation and is now intubated.  eICU Interventions  Started on dexamethasone, Remdesevir, tocilizumab, sedated and to be initatied on NMB as with vent desynchrony and remains hypoxic. May need to be proned. Enoxaparin for VTE prophylaxis. Dr Thora Lance seen at bedside     Intervention Category Major Interventions: Respiratory failure - evaluation and management;Infection - evaluation and management Evaluation Type: New Patient Evaluation  Darl Pikes 02/09/2020, 2:49 AM

## 2020-02-09 NOTE — Progress Notes (Signed)
NAME:  Gregory Harrington, MRN:  [id number], DOB:  01-Nov-1956, LOS: 1 ADMISSION DATE:  03/05/2020, CONSULTATION DATE:  02/09/20 REFERRING MD: Lynelle Doctor , CHIEF COMPLAINT:  Hypoxia   Brief History   64 y.o. M with PMH of COPD who was diagnosed with Covid-19 on 3/26, presented with worsening hypoxia and multi-focal PNA requiring intubation.    Past Medical History   has a past medical history of COPD (chronic obstructive pulmonary disease) (HCC). Tobacco use (quit 2007)  Significant Hospital Events   4/4 Admit to PCCM and initiate proning  4/5 paralyse & prone  Consults:    Procedures:  4/4 ETT >>  Significant Diagnostic Tests:  4/4 CXR>>  Micro Data:  4/4 POC Covid-19>>negative 4/4 BCx2>>  Antimicrobials:  Azithromycin 4/4 only Ceftriaxone 4/4-   Interim history/subjective:   Critically ill, intubated, sedated on propofol Paralyzed & prone Low urine output Slight hypothermic   Objective   Blood pressure 138/69, pulse 77, temperature (!) 96.2 F (35.7 C), temperature source Rectal, resp. rate (!) 30, height 5\' 11"  (1.803 m), weight 107 kg, SpO2 96 %.    Vent Mode: PRVC FiO2 (%):  [100 %] 100 % Set Rate:  [18 bmp-30 bmp] 30 bmp Vt Set:  [400 mL-500 mL] 400 mL PEEP:  [10 cmH20-20 cmH20] 20 cmH20 Plateau Pressure:  [27 cmH20-30 cmH20] 28 cmH20   Intake/Output Summary (Last 24 hours) at 02/09/2020 1440 Last data filed at 02/09/2020 1400 Gross per 24 hour  Intake 2147.98 ml  Output 530 ml  Net 1617.98 ml   Filed Weights   02/21/2020 1912 02/09/20 0245  Weight: 110 kg 107 kg   General:  Obese M, intubated and sedated, prone position HEENT: MM pink/moist, ETT Neuro:  Sedated, paralyzed CV: s1s2  rrr, no m/r/g PULM: Bilateral ventilated breath sounds GI: Unable to examine Extremities: warm/dry, 1+ edema  Skin: no rashes or lesions  Chest x-ray 4/5 personally reviewed, lines and tubes in position, bilateral unchanged patchy infiltrates  ABG shows respiratory acidosis. Labs  show mild hyperkalemia, stable creatinine 1.5, slight elevated LFTs, worsening leukocytosis  Resolved Hospital Problem list     Assessment & Plan:   ARDS secondary to Covid-19 PNA -Paralyzed &  prone P: -Lung protective ventilation , current plateau -25 and driving pressure -14 within range -High PEEP/20 -May need ECMO as salvage -Continue prone positioning for 16 hours, alternate with supine 8 hours-obtain ABG at hour 7 when supine   COVID-19 with Acute Pneumonia  Underlying COPD -Continue Dexamethasone and Remdesevir -s/p Actemra  -Significant leukocytosis of 39k likely exacerbated by recent prednisone, continue Ceftriaxone, completed a course of azithromycin outpatient , low threshold to expand to H CAP coverage, obtain respiratory culture -Scheduled nebs  Acute encephalopathy/need for paralysis -Goal for deep sedation, RASS -5 -Continue fentanyl gtt -Change from propofol to Versed due to high triglycerides    Acute Kidney Injury, likely acute -unclear baseline, creatinine 1.4  -likely pre-renal volume depletion in the setting of acute illness P: -Obtain FENa -avoid nephrotoxins and follow BMP    AGMA -Gap 16, CO2 18 -lactic acid pending -pH 7.35    Elevated Transaminases  -wife states he has had acute liver failure in the past during critical illness from appendicitis, but returned to baseline -no ETOH use -Discussed risk of worsening hepatic function with Actemra P: -Low LFTs Start tube feeds trickle  Best practice:  Diet: NPO Pain/Anxiety/Delirium protocol (if indicated): Fentanyl, Propofol VAP protocol (if indicated): HOB to 30 degrees, daily SBT DVT prophylaxis:  Lovenox GI prophylaxis: Protonix Glucose control: SSI Mobility: bed rest Code Status: full code Family Communication: wife updated 4/5 Disposition: ICU  The patient is critically ill with multiple organ systems failure and requires high complexity decision making for assessment and  support, frequent evaluation and titration of therapies, application of advanced monitoring technologies and extensive interpretation of multiple databases. Critical Care Time devoted to patient care services described in this note independent of APP/resident  time is 35 minutes.    Kara Mead MD. Shade Flood. [facility]  If no response to pager , please call [phone]   02/09/2020

## 2020-02-09 NOTE — Progress Notes (Signed)
Initial Nutrition Assessment  DOCUMENTATION CODES:   Obesity unspecified  INTERVENTION:   Initiate Vital High Protein @ 35 ml/hr via OGT (840 ml/day)  60 ml Prostat TID.    Tube feeding regimen provides 1440 kcal (100% of needs), 164 grams of protein, and 702 ml of H2O.   TF + propofol provides 2137 kcals  NUTRITION DIAGNOSIS:   Inadequate oral intake related to inability to eat as evidenced by NPO status.  GOAL:   Provide needs based on ASPEN/SCCM guidelines  MONITOR:   Vent status, Labs, Weight trends, TF tolerance, Skin, I & O's  REASON FOR ASSESSMENT:   Consult, Ventilator Assessment of nutrition requirement/status, Enteral/tube feeding initiation and management  ASSESSMENT:   63yM with history of COPD on advair presenting with increasing dyspnea noted to have cyanosis and hypoxia at home on pulse ox and found to have severe ARDS from covid-19 pneumonia.  Pt admitted with severe ARDS from COVID-19 pneumonia.   Patient is currently intubated on ventilator support. OGT placement verified by x-ray.  MV: 11.7 L/min Temp (24hrs), Avg:97.3 F (36.3 C), Min:94 F (34.4 C), Max:98.7 F (37.1 C)  Propofol: 26.4 ml/hr (provides 697 kcals/ daily)  MAP: 86  Reviewed I/O's: +1.2 L x 24 hours  UOP: 200 ml x 24 hours  Medications reviewed and include nimbex, IV decadron, and remdesivir.   No results found for: HGBA1C PTA DM medications are .   Labs reviewed: K: 5.3, Phos: 7.8, Mg: 3.0,  CBGS: 155-174 (inpatient orders for glycemic control are 0-15 units insulin aspart every 4 hours).   Diet Order:   Diet Order            Diet NPO time specified  Diet effective now              EDUCATION NEEDS:   Not appropriate for education at this time  Skin:  Skin Assessment: Reviewed RN Assessment  Last BM:  Unknown  Height:   Ht Readings from Last 1 Encounters:  02/09/20 5\' 11"  (1.803 m)    Weight:   Wt Readings from Last 1 Encounters:  02/09/20 107 kg     Ideal Body Weight:  78.2 kg  BMI:  Body mass index is 32.9 kg/m.  Estimated Nutritional Needs:   Kcal:  04/10/20  Protein:  > 156 grams  Fluid:  > 1.2 L    [id number], RD, LDN, CDCES Registered Dietitian II Certified Diabetes Care and Education Specialist Please refer to [facility] for RD and/or RD on-call/weekend/after hours pager

## 2020-02-09 NOTE — Progress Notes (Addendum)
PHARMACY - PHYSICIAN COMMUNICATION CRITICAL VALUE ALERT - BLOOD CULTURE IDENTIFICATION (BCID)  Gregory Harrington is an 64 y.o. male who presented to [facility] on 17-Feb-2020 with a chief complaint of PNA.  Name of physician (or Provider) ContactedVassie Loll (CCM)  Current antibiotics: Ceftriaxone  Changes to prescribed antibiotics recommended:  -Add vancomycin  Microbiology: -BCx 1 of 3 with GPCs in clusters   ADDENDUM: per micro, now 2 of 3 sets are positive, BCID with Staph spp (CoNS). Continue plan of adding vancomycin as above for now   Fredonia Highland, PharmD, BCPS Clinical Pharmacist [phone] Please check AMION for all [facility] Pharmacy numbers 02/09/2020

## 2020-02-09 NOTE — Progress Notes (Signed)
Patient proned at 0500. ETT taped at 26cm at center of mouth. Head is turned to right position.

## 2020-02-09 NOTE — Progress Notes (Signed)
Pharmacy Antibiotic Note  Gregory Harrington is a 64 y.o. male admitted on Mar 01, 2020 with worsening hypoxia after recent COVID diagnosis. Pt now found to have 1o3 blood cultures with GPCs in clusters. Pharmacy has been consulted for vancomycin dosing until speciation.  Plan: -Add vancomycin 1250mg  IV q24h - est AUC 480  Height: 5\' 11"  (180.3 cm) Weight: 107 kg (235 lb 14.3 oz) IBW/kg (Calculated) : 75.3  Temp (24hrs), Avg:96.7 F (35.9 C), Min:94 F (34.4 C), Max:98.7 F (37.1 C)  Recent Labs  Lab Mar 01, 2020 1842 03/01/2020 2113 2020-03-01 2210 02/09/20 0709  WBC 39.0* 30.8*  --  42.5*  CREATININE 1.46* 1.38*  --  1.50*  LATICACIDVEN  --   --  2.6*  --     Estimated Creatinine Clearance: 62.7 mL/min (A) (by C-G formula based on SCr of 1.5 mg/dL (H)).    No Known Allergies  Antimicrobials this admission: Ceftriaxone 4/4 >>    Microbiology results: 4/4 BCx: 1o3 GPCs  Thank you for allowing pharmacy to be a part of this patient's care.  6/4, PharmD, BCPS Clinical Pharmacist [phone] Please check AMION for all [facility] Pharmacy numbers 02/09/2020

## 2020-02-09 NOTE — Progress Notes (Signed)
I have updated Patient's wife Darl Pikes via phone and also offered Eink video chat

## 2020-02-09 NOTE — Procedures (Signed)
Central Venous Catheter Insertion Procedure Note Jayan Raymundo [id number] November 11, 1955  Procedure: Insertion of Central Venous Catheter Indications: Assessment of intravascular volume, Drug and/or fluid administration and Frequent blood sampling  Procedure Details Consent: Risks of procedure as well as the alternatives and risks of each were explained to the (patient/caregiver).  Consent for procedure obtained. Time Out: Verified patient identification, verified procedure, site/side was marked, verified correct patient position, special equipment/implants available, medications/allergies/relevent history reviewed, required imaging and test results available.  Performed  Maximum sterile technique was used including antiseptics, cap, gloves, gown, hand hygiene, mask and sheet. Skin prep: Chlorhexidine; local anesthetic administered A antimicrobial bonded/coated triple lumen catheter was placed in the right femoral vein due to proning, no other available access using the Seldinger technique and verified with [place].  Evaluation Blood flow good Complications: No apparent complications Patient did tolerate procedure well. Chest X-ray ordered to verify placement.  CXR: not indicated.  Darcella Gasman Ozell Juhasz 02/09/2020, 3:44 AM

## 2020-02-10 ENCOUNTER — Inpatient Hospital Stay (HOSPITAL_COMMUNITY): Payer: BC Managed Care – PPO

## 2020-02-10 DIAGNOSIS — J9600 Acute respiratory failure, unspecified whether with hypoxia or hypercapnia: Secondary | ICD-10-CM | POA: Diagnosis not present

## 2020-02-10 DIAGNOSIS — J9601 Acute respiratory failure with hypoxia: Secondary | ICD-10-CM | POA: Diagnosis not present

## 2020-02-10 DIAGNOSIS — U071 COVID-19: Secondary | ICD-10-CM | POA: Diagnosis not present

## 2020-02-10 DIAGNOSIS — J80 Acute respiratory distress syndrome: Secondary | ICD-10-CM | POA: Diagnosis not present

## 2020-02-10 DIAGNOSIS — N179 Acute kidney failure, unspecified: Secondary | ICD-10-CM

## 2020-02-10 LAB — POCT I-STAT 7, (LYTES, BLD GAS, ICA,H+H)
Acid-Base Excess: 4 mmol/L — ABNORMAL HIGH (ref 0.0–2.0)
Acid-base deficit: 2 mmol/L (ref 0.0–2.0)
Bicarbonate: 30.6 mmol/L — ABNORMAL HIGH (ref 20.0–28.0)
Bicarbonate: 35.2 mmol/L — ABNORMAL HIGH (ref 20.0–28.0)
Calcium, Ion: 1.16 mmol/L (ref 1.15–1.40)
Calcium, Ion: 1.13 mmol/L — ABNORMAL LOW (ref 1.15–1.40)
HCT: 44 % (ref 39.0–52.0)
HCT: 43 % (ref 39.0–52.0)
Hemoglobin: 15 g/dL (ref 13.0–17.0)
Hemoglobin: 14.6 g/dL (ref 13.0–17.0)
O2 Saturation: 93 %
O2 Saturation: 93 %
Patient temperature: 98.6
Patient temperature: 102.2
Potassium: 6.1 mmol/L — ABNORMAL HIGH (ref 3.5–5.1)
Potassium: 5.7 mmol/L — ABNORMAL HIGH (ref 3.5–5.1)
Sodium: 145 mmol/L (ref 135–145)
Sodium: 148 mmol/L — ABNORMAL HIGH (ref 135–145)
TCO2: 33 mmol/L — ABNORMAL HIGH (ref 22–32)
TCO2: 38 mmol/L — ABNORMAL HIGH (ref 22–32)
pCO2 arterial: 92.7 mmHg (ref 32.0–48.0)
pCO2 arterial: 93.2 mmHg (ref 32.0–48.0)
pH, Arterial: 7.127 — CL (ref 7.350–7.450)
pH, Arterial: 7.196 — CL (ref 7.350–7.450)
pO2, Arterial: 93 mmHg (ref 83.0–108.0)
pO2, Arterial: 94 mmHg (ref 83.0–108.0)

## 2020-02-10 LAB — BASIC METABOLIC PANEL
Anion gap: 11 (ref 5–15)
Anion gap: 8 (ref 5–15)
Anion gap: 11 (ref 5–15)
Anion gap: 9 (ref 5–15)
BUN: 60 mg/dL — ABNORMAL HIGH (ref 8–23)
BUN: 66 mg/dL — ABNORMAL HIGH (ref 8–23)
BUN: 70 mg/dL — ABNORMAL HIGH (ref 8–23)
BUN: 68 mg/dL — ABNORMAL HIGH (ref 8–23)
CO2: 26 mmol/L (ref 22–32)
CO2: 33 mmol/L — ABNORMAL HIGH (ref 22–32)
CO2: 31 mmol/L (ref 22–32)
CO2: 34 mmol/L — ABNORMAL HIGH (ref 22–32)
Calcium: 7.9 mg/dL — ABNORMAL LOW (ref 8.9–10.3)
Calcium: 7.5 mg/dL — ABNORMAL LOW (ref 8.9–10.3)
Calcium: 7.7 mg/dL — ABNORMAL LOW (ref 8.9–10.3)
Calcium: 8 mg/dL — ABNORMAL LOW (ref 8.9–10.3)
Chloride: 108 mmol/L (ref 98–111)
Chloride: 108 mmol/L (ref 98–111)
Chloride: 108 mmol/L (ref 98–111)
Chloride: 107 mmol/L (ref 98–111)
Creatinine, Ser: 1.99 mg/dL — ABNORMAL HIGH (ref 0.61–1.24)
Creatinine, Ser: 1.93 mg/dL — ABNORMAL HIGH (ref 0.61–1.24)
Creatinine, Ser: 1.99 mg/dL — ABNORMAL HIGH (ref 0.61–1.24)
Creatinine, Ser: 1.58 mg/dL — ABNORMAL HIGH (ref 0.61–1.24)
GFR calc Af Amer: 40 mL/min — ABNORMAL LOW (ref >60)
GFR calc Af Amer: 42 mL/min — ABNORMAL LOW (ref >60)
GFR calc Af Amer: 40 mL/min — ABNORMAL LOW (ref >60)
GFR calc Af Amer: 53 mL/min — ABNORMAL LOW (ref >60)
GFR calc non Af Amer: 35 mL/min — ABNORMAL LOW (ref >60)
GFR calc non Af Amer: 36 mL/min — ABNORMAL LOW (ref >60)
GFR calc non Af Amer: 35 mL/min — ABNORMAL LOW (ref >60)
GFR calc non Af Amer: 46 mL/min — ABNORMAL LOW (ref >60)
Glucose, Bld: 229 mg/dL — ABNORMAL HIGH (ref 70–99)
Glucose, Bld: 223 mg/dL — ABNORMAL HIGH (ref 70–99)
Glucose, Bld: 251 mg/dL — ABNORMAL HIGH (ref 70–99)
Glucose, Bld: 219 mg/dL — ABNORMAL HIGH (ref 70–99)
Potassium: 6.5 mmol/L (ref 3.5–5.1)
Potassium: 6 mmol/L — ABNORMAL HIGH (ref 3.5–5.1)
Potassium: 5.8 mmol/L — ABNORMAL HIGH (ref 3.5–5.1)
Potassium: 4.8 mmol/L (ref 3.5–5.1)
Sodium: 145 mmol/L (ref 135–145)
Sodium: 149 mmol/L — ABNORMAL HIGH (ref 135–145)
Sodium: 150 mmol/L — ABNORMAL HIGH (ref 135–145)
Sodium: 150 mmol/L — ABNORMAL HIGH (ref 135–145)

## 2020-02-10 LAB — FERRITIN: Ferritin: 779 ng/mL — ABNORMAL HIGH (ref 24–336)

## 2020-02-10 LAB — HEPATIC FUNCTION PANEL
ALT: 80 U/L — ABNORMAL HIGH (ref 0–44)
AST: 35 U/L (ref 15–41)
Albumin: 2 g/dL — ABNORMAL LOW (ref 3.5–5.0)
Alkaline Phosphatase: 89 U/L (ref 38–126)
Bilirubin, Direct: <0.1 mg/dL (ref 0.0–0.2)
Total Bilirubin: 0.5 mg/dL (ref 0.3–1.2)
Total Protein: 5.7 g/dL — ABNORMAL LOW (ref 6.5–8.1)

## 2020-02-10 LAB — CBC
HCT: 44.4 % (ref 39.0–52.0)
Hemoglobin: 13.4 g/dL (ref 13.0–17.0)
MCH: 31.8 pg (ref 26.0–34.0)
MCHC: 30.2 g/dL (ref 30.0–36.0)
MCV: 105.5 fL — ABNORMAL HIGH (ref 80.0–100.0)
Platelets: 317 10*3/uL (ref 150–400)
RBC: 4.21 MIL/uL — ABNORMAL LOW (ref 4.22–5.81)
RDW: 14.8 % (ref 11.5–15.5)
WBC: 35.8 10*3/uL — ABNORMAL HIGH (ref 4.0–10.5)
nRBC: 4.7 % — ABNORMAL HIGH (ref 0.0–0.2)

## 2020-02-10 LAB — ECHOCARDIOGRAM LIMITED
Height: 71 in
Weight: 3781.33 oz

## 2020-02-10 LAB — GLUCOSE, CAPILLARY
Glucose-Capillary: 186 mg/dL — ABNORMAL HIGH (ref 70–99)
Glucose-Capillary: 224 mg/dL — ABNORMAL HIGH (ref 70–99)
Glucose-Capillary: 214 mg/dL — ABNORMAL HIGH (ref 70–99)
Glucose-Capillary: 197 mg/dL — ABNORMAL HIGH (ref 70–99)
Glucose-Capillary: 216 mg/dL — ABNORMAL HIGH (ref 70–99)
Glucose-Capillary: 191 mg/dL — ABNORMAL HIGH (ref 70–99)

## 2020-02-10 LAB — PATHOLOGIST SMEAR REVIEW

## 2020-02-10 LAB — C-REACTIVE PROTEIN: CRP: 12.7 mg/dL — ABNORMAL HIGH (ref <1.0)

## 2020-02-10 LAB — PHOSPHORUS: Phosphorus: 7.1 mg/dL — ABNORMAL HIGH (ref 2.5–4.6)

## 2020-02-10 LAB — MAGNESIUM: Magnesium: 3.1 mg/dL — ABNORMAL HIGH (ref 1.7–2.4)

## 2020-02-10 LAB — HEMOGLOBIN A1C
Hgb A1c MFr Bld: 6 % — ABNORMAL HIGH (ref 4.8–5.6)
Mean Plasma Glucose: 126 mg/dL

## 2020-02-10 MED ORDER — SODIUM BICARBONATE 8.4 % IV SOLN
100.0000 meq | Freq: Once | INTRAVENOUS | Status: AC
  Administered 2020-02-10: 100 meq via INTRAVENOUS
  Filled 2020-02-10: qty 100

## 2020-02-10 MED ORDER — SODIUM POLYSTYRENE SULFONATE 15 GM/60ML PO SUSP
30.0000 g | Freq: Once | ORAL | Status: AC
  Administered 2020-02-10: 30 g
  Filled 2020-02-10: qty 120

## 2020-02-10 MED ORDER — CEFAZOLIN SODIUM-DEXTROSE 1-4 GM/50ML-% IV SOLN
1.0000 g | Freq: Three times a day (TID) | INTRAVENOUS | Status: DC
  Administered 2020-02-10 – 2020-02-11 (×3): 1 g via INTRAVENOUS
  Filled 2020-02-10 (×7): qty 50

## 2020-02-10 MED ORDER — STERILE WATER FOR INJECTION IV SOLN
INTRAVENOUS | Status: DC
  Filled 2020-02-10 (×3): qty 850

## 2020-02-10 MED ORDER — CALCIUM GLUCONATE-NACL 1-0.675 GM/50ML-% IV SOLN
1.0000 g | Freq: Once | INTRAVENOUS | Status: AC
  Administered 2020-02-10: 1000 mg via INTRAVENOUS
  Filled 2020-02-10 (×2): qty 50

## 2020-02-10 MED ORDER — PANTOPRAZOLE SODIUM 40 MG PO PACK
40.0000 mg | PACK | Freq: Every day | ORAL | Status: DC
  Administered 2020-02-10 – 2020-02-23 (×14): 40 mg
  Filled 2020-02-10 (×14): qty 20

## 2020-02-10 MED ORDER — SODIUM BICARBONATE 8.4 % IV SOLN
INTRAVENOUS | Status: AC
  Filled 2020-02-10: qty 50

## 2020-02-10 MED ORDER — SODIUM BICARBONATE 8.4 % IV SOLN
100.0000 meq | Freq: Once | INTRAVENOUS | Status: AC
  Administered 2020-02-10: 100 meq via INTRAVENOUS
  Filled 2020-02-10: qty 50

## 2020-02-10 MED ORDER — FREE WATER
200.0000 mL | Status: DC
  Administered 2020-02-10 – 2020-02-19 (×49): 200 mL

## 2020-02-10 NOTE — Progress Notes (Signed)
eLink Physician-Brief Progress Note Patient Name: Gregory Harrington DOB: Jun 11, 1956 MRN: [id number]   Date of Service  02/10/2020  HPI/Events of Note  K+ = 6.5. EKG on bedside monitor does not reveal widened QRS or peaked T wave.   eICU Interventions  Will order: 1. Kayexalate 30 gm per tube now. 2. Repeat BMP at 10 AM.      Intervention Category Major Interventions: Electrolyte abnormality - evaluation and management  [name] 02/10/2020, 3:57 AM

## 2020-02-10 NOTE — Progress Notes (Signed)
Patient's head turned to left and arms repositioned. 

## 2020-02-10 NOTE — Progress Notes (Signed)
eLink Physician-Brief Progress Note Patient Name: Gregory Harrington DOB: Oct 24, 1956 MRN: [id number]   Date of Service  02/10/2020  HPI/Events of Note  ABG on 100%/PRVC 30/TV 400/P 20 = 7.127/92.7/93.0.   eICU Interventions  Will order: 1. NaHCO3 100 meq IV now.  2. NaHCO3 IV infusion to run IV at 100 mL/hour.  3. Repeat ABG at 8 AM.      Intervention Category Major Interventions: Acid-Base disturbance - evaluation and management;Respiratory failure - evaluation and management  Gregory Harrington 02/10/2020, 4:31 AM

## 2020-02-10 NOTE — Progress Notes (Signed)
Patient placed in prone position by RT x 1, NT x 1 and RN x 3 without complications. ETT was re-secured at 25 in the center using cloth tape. Foam pads on cheeks and above the upper lip for protection.

## 2020-02-10 NOTE — Progress Notes (Signed)
NAME:  Gregory Harrington, MRN:  [id number], DOB:  May 31, 1956, LOS: 2 ADMISSION DATE:  02-11-20, CONSULTATION DATE:  02/10/20 REFERRING MD: Tomi Bamberger , CHIEF COMPLAINT:  Hypoxia   Brief History   64 y.o. M with PMH of COPD who was diagnosed with Covid-19 on 3/26, presented with worsening hypoxia and multi-focal PNA requiring intubation.    Past Medical History   has a past medical history of COPD (chronic obstructive pulmonary disease) ([place]). Tobacco use (quit 2007)  Significant Hospital Events   4/4 Admit to PCCM and initiate proning  4/5 paralyse & prone  Consults:    Procedures:  4/4 ETT >> 4/5 RT fem CVL >>  4/6 echo >>nml LV function  Micro Data:  4/4 POC Covid-19>>negative 4/4 BCx2>> MSSE 2/2 4/5 resp >>  Antimicrobials:  Azithromycin 4/4 only Ceftriaxone 4/4 >>  Vanc 4/5 >>  Interim history/subjective:   Critically ill, intubated, sedated on versed/ fent gtt Paralyzed  Urine output improving ABG showed acute respiratory acidosis, started on bicarb drip   Objective   Blood pressure 133/77, pulse (!) 102, temperature 100.1 F (37.8 C), temperature source Oral, resp. rate (!) 32, height 5\' 11"  (1.803 m), weight 107.2 kg, SpO2 91 %.    Vent Mode: PRVC FiO2 (%):  [100 %] 100 % Set Rate:  [30 bmp-32 bmp] 32 bmp Vt Set:  [400 mL-450 mL] 450 mL PEEP:  [20 cmH20] 20 cmH20 Plateau Pressure:  [28 cmH20-30 cmH20] 30 cmH20   Intake/Output Summary (Last 24 hours) at 02/10/2020 1102 Last data filed at 02/10/2020 1040 Gross per 24 hour  Intake 2873 ml  Output 1510 ml  Net 1363 ml   Filed Weights   Feb 11, 2020 1912 02/09/20 0245 02/10/20 0500  Weight: 110 kg 107 kg 107.2 kg   General:  Obese M, intubated and sedated, supine position HEENT: MM pink/moist, ETT Neuro:  Sedated, paralyzed, RASS -5 CV: s1s2  rrr, no m/r/g PULM: Bilateral ventilated breath sounds GI: Soft nontender Extremities: warm/dry, 1+ edema  Skin: no rashes or lesions  Chest x-ray 4/6 personally reviewed,  lines and tubes in position, bilateral unchanged patchy infiltrates  ABG shows acute respiratory acidosis. Labs shows hyperkalemia, hypernatremia, slight increase in creatinine to 1.9, slight improvement in extreme leukocytosis  Resolved Hospital Problem list     Assessment & Plan:   ARDS secondary to Covid-19 PNA  P: -Lung protective ventilation , 6 cc/kg = 450 cc, current plateau -30 and driving pressure -10 at range -High PEEP/20 -Nimbex with goal vent synchrony -May need ECMO as salvage -Continue prone positioning for 16 hours, alternate with supine 8 hours   COVID-19 with Acute Pneumonia  Underlying COPD, quit smoking 2004 -Continue Dexamethasone x10ds  and Remdesevir x 5ds  -s/p Actemra  -Scheduled nebs  MSSE bacteremia 2/2 --Significant leukocytosis of 39k likely exacerbated by recent prednisone,completed a course of azithromycin outpatient  -Favor contaminant but will treat given other indicators, currently on ceftriaxone and vancomycin/will simplify to Ancef await respiratory culture  Acute encephalopathy/need for paralysis -Goal for deep sedation, RASS -5 -Continue fentanyl and Versed gtt    Acute Kidney Injury -unclear baseline, creatinine 1.4  -FENa>1, adjusting ATN, nonoliguric P: -avoid nephrotoxins and follow BMP  -hoping to avoid HD here  Acute respiratory acidosis Hyperkalemia-likely induced by acidosis -Treat hyperkalemia with Lokelma -Bicarbonate drip   Elevated Transaminases  -wife states he has had acute liver failure in the past during critical illness from appendicitis, but returned to baseline -no ETOH use  P: -Follow  LFTs -Ct tube feeds trickle  Goals of care -discussed with wife Darl Pikes, explained mortality of severe ARDS.  His baseline has been deteriorating due to COPD and as such he does not seem to be a good ECMO candidate.  He would not want to be "hooked up to machines".  He would prefer quality over quantity of life.  We decided  to issue a limited CODE STATUS with no CPR in case of cardiac arrest but continue full medical care.  She would be amenable to transition to full comfort if his condition were considered "hopeless"  Best practice:  Diet: NPO Pain/Anxiety/Delirium protocol (if indicated): Fentanyl, versed VAP protocol (if indicated): HOB to 30 degrees, daily SBT DVT prophylaxis: Lovenox GI prophylaxis: Protonix Glucose control: SSI Mobility: bed rest Code Status: full code Family Communication: wife updated 4/6 Disposition: ICU  The patient is critically ill with multiple organ systems failure and requires high complexity decision making for assessment and support, frequent evaluation and titration of therapies, application of advanced monitoring technologies and extensive interpretation of multiple databases. Critical Care Time devoted to patient care services described in this note independent of APP/resident  time is 45 minutes.    Cyril Mourning MD. Tonny Bollman. [place] [facility]  If no response to pager , please call [phone]   02/10/2020

## 2020-02-10 NOTE — Progress Notes (Addendum)
eLink Physician-Brief Progress Note Patient Name: Gregory Harrington DOB: 05/16/1956 MRN: [id number]   Date of Service  02/10/2020  HPI/Events of Note  ABG on 100%/PRVC 30/TV 400/P 20 = 7.122/82.8/109.0. PF ratio = 109 and K+ = 6.4 on ABG.  eICU Interventions  Will order; 1. BMP STAT.  2. NaHCO3 IV now.     Intervention Category Major Interventions: Respiratory failure - evaluation and management;Electrolyte abnormality - evaluation and management  Jazmine Heckman Eugene 02/10/2020, 12:38 AM

## 2020-02-10 NOTE — Progress Notes (Signed)
  Echocardiogram 2D Echocardiogram has been performed.  Delcie Roch 02/10/2020, 8:48 AM

## 2020-02-10 NOTE — Progress Notes (Signed)
Pharmacy Antibiotic Note  Gregory Harrington is a 64 y.o. male admitted on 03/04/2020 with sepsis.  Pharmacy has been consulted for Cefazolin dosing.  Serum creatinine has been rising, will need to monitor, but CrCl still > 30 ml/min and UOP yesterday was improved.   Plan: Start Ancef 1 gram IV q8hr Monitor renal function, monitor cultures and sensitivities.  Height: 5\' 11"  (180.3 cm) Weight: 107.2 kg (236 lb 5.3 oz) IBW/kg (Calculated) : 75.3  Temp (24hrs), Avg:98.7 F (37.1 C), Min:96.2 F (35.7 C), Max:102.2 F (39 C)  Recent Labs  Lab 03/01/2020 1842 02/14/2020 1842 02/16/2020 2113 02/14/2020 2210 02/09/20 0709 02/10/20 0148 02/10/20 0443 02/10/20 0920  WBC 39.0*  --  30.8*  --  42.5*  --  35.8*  --   CREATININE 1.46*   < > 1.38*  --  1.50* 1.99* 1.93* 1.99*  LATICACIDVEN  --   --   --  2.6*  --   --   --   --    < > = values in this interval not displayed.    Estimated Creatinine Clearance: 47.3 mL/min (A) (by C-G formula based on SCr of 1.99 mg/dL (H)).    No Known Allergies  Antimicrobials this admission: Vanc 4/5 >> 4/6 Rocephin 4/4 >> 4/6 Ancef 4/6 >>  Microbiology results: 4/4 BCx: 2 out of 3 - staph species, no resistance 4/5 Sputum: GPCs/GPRs   Thank you for allowing pharmacy to be a part of this patient's care.  6/4, PharmD, [facility] Clinical Pharmacist Please see AMION for all Pharmacists' Contact Phone Numbers 02/10/2020, 11:30 AM

## 2020-02-10 NOTE — Progress Notes (Signed)
Critical ABG values given to Dr. Vassie Loll.

## 2020-02-10 NOTE — Progress Notes (Signed)
Patient's head turned to right and arms repositioned. 

## 2020-02-11 ENCOUNTER — Inpatient Hospital Stay (HOSPITAL_COMMUNITY): Payer: BC Managed Care – PPO

## 2020-02-11 DIAGNOSIS — J80 Acute respiratory distress syndrome: Secondary | ICD-10-CM | POA: Diagnosis not present

## 2020-02-11 DIAGNOSIS — A4189 Other specified sepsis: Secondary | ICD-10-CM | POA: Diagnosis not present

## 2020-02-11 DIAGNOSIS — J9600 Acute respiratory failure, unspecified whether with hypoxia or hypercapnia: Secondary | ICD-10-CM | POA: Diagnosis not present

## 2020-02-11 DIAGNOSIS — U071 COVID-19: Secondary | ICD-10-CM | POA: Diagnosis not present

## 2020-02-11 LAB — CULTURE, BLOOD (ROUTINE X 2): Special Requests: ADEQUATE

## 2020-02-11 LAB — CULTURE, RESPIRATORY W GRAM STAIN: Culture: NORMAL

## 2020-02-11 LAB — PHOSPHORUS: Phosphorus: 3 mg/dL (ref 2.5–4.6)

## 2020-02-11 LAB — BASIC METABOLIC PANEL
Anion gap: 9 (ref 5–15)
BUN: 64 mg/dL — ABNORMAL HIGH (ref 8–23)
CO2: 37 mmol/L — ABNORMAL HIGH (ref 22–32)
Calcium: 7.8 mg/dL — ABNORMAL LOW (ref 8.9–10.3)
Chloride: 108 mmol/L (ref 98–111)
Creatinine, Ser: 1.26 mg/dL — ABNORMAL HIGH (ref 0.61–1.24)
GFR calc Af Amer: >60 mL/min (ref >60)
GFR calc non Af Amer: >60 mL/min (ref >60)
Glucose, Bld: 184 mg/dL — ABNORMAL HIGH (ref 70–99)
Potassium: 4.6 mmol/L (ref 3.5–5.1)
Sodium: 154 mmol/L — ABNORMAL HIGH (ref 135–145)

## 2020-02-11 LAB — CBC
HCT: 43.2 % (ref 39.0–52.0)
Hemoglobin: 13 g/dL (ref 13.0–17.0)
MCH: 31.2 pg (ref 26.0–34.0)
MCHC: 30.1 g/dL (ref 30.0–36.0)
MCV: 103.6 fL — ABNORMAL HIGH (ref 80.0–100.0)
Platelets: 189 10*3/uL (ref 150–400)
RBC: 4.17 MIL/uL — ABNORMAL LOW (ref 4.22–5.81)
RDW: 14.9 % (ref 11.5–15.5)
WBC: 17.6 10*3/uL — ABNORMAL HIGH (ref 4.0–10.5)
nRBC: 1.1 % — ABNORMAL HIGH (ref 0.0–0.2)

## 2020-02-11 LAB — POCT I-STAT 7, (LYTES, BLD GAS, ICA,H+H)
Acid-Base Excess: 15 mmol/L — ABNORMAL HIGH (ref 0.0–2.0)
Bicarbonate: 42.9 mmol/L — ABNORMAL HIGH (ref 20.0–28.0)
Calcium, Ion: 1.11 mmol/L — ABNORMAL LOW (ref 1.15–1.40)
HCT: 40 % (ref 39.0–52.0)
Hemoglobin: 13.6 g/dL (ref 13.0–17.0)
O2 Saturation: 90 %
Patient temperature: 97.7
Potassium: 4.4 mmol/L (ref 3.5–5.1)
Sodium: 151 mmol/L — ABNORMAL HIGH (ref 135–145)
TCO2: 45 mmol/L — ABNORMAL HIGH (ref 22–32)
pCO2 arterial: 62.1 mmHg — ABNORMAL HIGH (ref 32.0–48.0)
pH, Arterial: 7.445 (ref 7.350–7.450)
pO2, Arterial: 58 mmHg — ABNORMAL LOW (ref 83.0–108.0)

## 2020-02-11 LAB — FERRITIN: Ferritin: 730 ng/mL — ABNORMAL HIGH (ref 24–336)

## 2020-02-11 LAB — C-REACTIVE PROTEIN: CRP: 5.4 mg/dL — ABNORMAL HIGH (ref <1.0)

## 2020-02-11 LAB — GLUCOSE, CAPILLARY
Glucose-Capillary: 164 mg/dL — ABNORMAL HIGH (ref 70–99)
Glucose-Capillary: 153 mg/dL — ABNORMAL HIGH (ref 70–99)
Glucose-Capillary: 188 mg/dL — ABNORMAL HIGH (ref 70–99)
Glucose-Capillary: 157 mg/dL — ABNORMAL HIGH (ref 70–99)
Glucose-Capillary: 134 mg/dL — ABNORMAL HIGH (ref 70–99)
Glucose-Capillary: 184 mg/dL — ABNORMAL HIGH (ref 70–99)

## 2020-02-11 LAB — MAGNESIUM: Magnesium: 3 mg/dL — ABNORMAL HIGH (ref 1.7–2.4)

## 2020-02-11 MED ORDER — ENOXAPARIN SODIUM 40 MG/0.4ML ~~LOC~~ SOLN
40.0000 mg | Freq: Two times a day (BID) | SUBCUTANEOUS | Status: DC
  Administered 2020-02-11 – 2020-02-21 (×21): 40 mg via SUBCUTANEOUS
  Filled 2020-02-11 (×21): qty 0.4

## 2020-02-11 MED ORDER — CEFAZOLIN SODIUM-DEXTROSE 2-4 GM/100ML-% IV SOLN
2.0000 g | Freq: Three times a day (TID) | INTRAVENOUS | Status: AC
  Administered 2020-02-11 – 2020-02-14 (×11): 2 g via INTRAVENOUS
  Filled 2020-02-11 (×11): qty 100

## 2020-02-11 MED ORDER — DEXTROSE 5 % IV BOLUS
500.0000 mL | Freq: Once | INTRAVENOUS | Status: AC
  Administered 2020-02-11: 500 mL via INTRAVENOUS

## 2020-02-11 NOTE — Progress Notes (Signed)
NAME:  Gregory Harrington, MRN:  [id number], DOB:  [birth year], LOS: 3 ADMISSION DATE:  2020-03-08, CONSULTATION DATE:  02/11/20 REFERRING MD: Tomi Bamberger , CHIEF COMPLAINT:  Hypoxia   Brief History   64 y.o. M with PMH of COPD who was diagnosed with Covid-19 on 3/26, presented with worsening hypoxia and multi-focal PNA requiring intubation.   Past Medical History   has a past medical history of COPD (chronic obstructive pulmonary disease) ([place]). Tobacco use (quit 2007)  Significant Hospital Events   4/4 Admit to PCCM and initiate proning  4/5 paralyse & prone 4/7-remains prone, hypoxemic Consults:    Procedures:  4/4 ETT >> 4/5 RT fem CVL >>  4/6 echo >>nml LV function  Micro Data:  4/4 POC Covid-19>>negative 4/4 BCx2>> MSSE 2/2 4/5 resp >> rare gram positive cocci, rare gram-positive rods  Antimicrobials:  Azithromycin 4/4 only Ceftriaxone 4/4 >>  Vanc 4/5 >>  Interim history/subjective:   Critically ill, intubated, sedated on versed/ fent gtt Paralyzed, proned Urine output improving On bicarb drip  Objective   Blood pressure 128/68, pulse (!) 57, temperature 97.6 F (36.4 C), temperature source Oral, resp. rate (!) 32, height 5\' 11"  (1.803 m), weight 109.1 kg, SpO2 98 %.    Vent Mode: PRVC FiO2 (%):  [100 %] 100 % Set Rate:  [32 bmp] 32 bmp Vt Set:  [450 mL] 450 mL PEEP:  [20 cmH20] 20 cmH20 Plateau Pressure:  [29 cmH20-31 cmH20] 31 cmH20   Intake/Output Summary (Last 24 hours) at 02/11/2020 0815 Last data filed at 02/11/2020 0800 Gross per 24 hour  Intake 4900.72 ml  Output 1900 ml  Net 3000.72 ml   Filed Weights   02/09/20 0245 02/10/20 0500 02/11/20 0451  Weight: 107 kg 107.2 kg 109.1 kg   General: Obese, sedated, prone  HEENT: Endotracheal tube in place Neuro:  Sedated, paralyzed, RASS -5 CV: S1-S2 appreciated, PULM: Bilateral breath sounds appreciated posteriorly GI: Prone Extremities: warm/dry, 1+ edema  Skin: no rashes or lesions  Chest x-ray 4/6 personally  reviewed, lines and tubes in position, bilateral unchanged patchy infiltrates 4/7-bilateral infiltrate unchanged  ABG shows acute respiratory acidosis. Labs shows hyperkalemia, hypernatremia, slight increase in creatinine to 1.9, slight improvement in extreme leukocytosis  Resolved Hospital Problem list     Assessment & Plan:   ARDS secondary to Covid-19 PNA P: Continue mechanical ventilation per ARDS protocol Target TVol 6-8cc/kgIBW Target Plateau Pressure < 30cm H20 Target driving pressure less than 15 cm of water Target PaO2 55-65: titrate PEEP/FiO2 per protocol As long as PaO2 to FiO2 ratio is less than 1:150 position in prone position for 16 hours a day Prone alternate with supine 16 hours / 8 hours  COVID-19 pneumonia -On dexamethasone -On remdesivir -S/p Actemra -Scheduled neb bronchodilators   MSSE bacteremia 2/2 -Currently on Ancef -Significant leukocytosis initially, this is trending better -Respiratory cultures not revealing at present  Acute encephalopathy -Deep sedation with RASS goal of -5 -On fentanyl and Versed gtt.  Acute kidney injury -Current creatinine of 1.4 -Avoid nephrotoxic's -Continue to trend  Hypernatremia -Free water -Add D5  Hyperkalemia -Lokelma was given  Acute encephalopathy/need for paralysis -Goal for deep sedation, RASS -5 -Continue fentanyl and Versed gtt  Elevated transaminases -History of liver failure in the past but did return to baseline -No history of EtOH use -Trend LFTs  Goals of care was discussed on 4/6-discussed with wife [name], explained mortality of severe ARDS.  His baseline has been deteriorating due to COPD and as such  he does not seem to be a good ECMO candidate.  He would not want to be "hooked up to machines".  He would prefer quality over quantity of life.  We decided to issue a limited CODE STATUS with no CPR in case of cardiac arrest but continue full medical care.  She would be amenable to transition  to full comfort if his condition were considered "hopeless"  Best practice:  Diet: N.p.o. Pain/Anxiety/Delirium protocol (if indicated): Fentanyl, versed VAP protocol (if indicated): HOB to 30 degrees, daily SBT DVT prophylaxis: Lovenox GI prophylaxis: Protonix Glucose control: SSI Mobility: bed rest Code Status: full code Family Communication: wife updated 4/6 Disposition: ICU  The patient is critically ill with multiple organ systems failure and requires high complexity decision making for assessment and support, frequent evaluation and titration of therapies, application of advanced monitoring technologies and extensive interpretation of multiple databases. Critical Care Time devoted to patient care services described in this note independent of APP/resident time (if applicable)  is 36 minutes.   Virl Diamond MD [facility] Personal pager: [phone] If unanswered, please page CCM On-call: #[phone]

## 2020-02-11 NOTE — Progress Notes (Signed)
Patient's head turned to left and arms repositioned. 

## 2020-02-11 NOTE — Progress Notes (Signed)
Pt returned to supine position with RT x1, RN x2 and NT x1. Pt ETT remains secure at 25cm with cloth tape and protective barriers in place. RT will continue to monitor.

## 2020-02-11 NOTE — Progress Notes (Signed)
Per Dr. Wynona Neat, pt will need to be reproned. RT will continue to monitor.

## 2020-02-11 NOTE — Progress Notes (Signed)
Pt ETT remains secured with cloth tape at 25cm at the lip. Protective barriers in place on the pt cheeks and upper lip. Pt placed into prone position with RT x1, RN x2 and NT x1. RT will continue to monitor.

## 2020-02-11 NOTE — Progress Notes (Signed)
No breakdown to pts lips or face noted at this time.

## 2020-02-11 NOTE — Progress Notes (Signed)
Pharmacy Antibiotic Note  Gregory Harrington is a 64 y.o. male admitted on 03-01-20 with sepsis and COVID-19 PNA/staph epidermidis bacteremia.  Pharmacy has been consulted for Cefazolin dosing. SCr improved to 1.26.  Plan: Increase cefazolin to 2g IV q8h Monitor clinical progress, c/s, renal function F/u de-escalation plan/LOT   Height: 5\' 11"  (180.3 cm) Weight: 109.1 kg (240 lb 8.4 oz) IBW/kg (Calculated) : 75.3  Temp (24hrs), Avg:98.6 F (37 C), Min:97.5 F (36.4 C), Max:100.3 F (37.9 C)  Recent Labs  Lab March 01, 2020 1842 03/01/2020 1842 01-Mar-2020 2113 03/01/2020 2113 03/01/2020 2210 02/09/20 0709 02/09/20 0709 02/10/20 0148 02/10/20 0443 02/10/20 0920 02/10/20 1729 02/11/20 0500  WBC 39.0*  --  30.8*  --   --  42.5*  --   --  35.8*  --   --  17.6*  CREATININE 1.46*   < > 1.38*   < >  --  1.50*   < > 1.99* 1.93* 1.99* 1.58* 1.26*  LATICACIDVEN  --   --   --   --  2.6*  --   --   --   --   --   --   --    < > = values in this interval not displayed.    Estimated Creatinine Clearance: 75.4 mL/min (A) (by C-G formula based on SCr of 1.26 mg/dL (H)).    No Known Allergies  Antimicrobials this admission: Vanc 4/5 >> 4/6 Rocephin 4/4 >> 4/6 Ancef 4/6 >> Remdesivir 4/4 >> 4/8 Azithro 4/4 x 1  Microbiology results: 4/4 covid + 4/4 mrsa pcr - neg 4/4 BCx: 2 out of 3 MSSE 4/5 Sputum: GPCs/GPRs    6/4, PharmD, BCPS Please check AMION for all [facility] contact numbers Clinical Pharmacist 02/11/2020 8:49 AM

## 2020-02-12 DIAGNOSIS — J9601 Acute respiratory failure with hypoxia: Secondary | ICD-10-CM | POA: Diagnosis not present

## 2020-02-12 DIAGNOSIS — J9602 Acute respiratory failure with hypercapnia: Secondary | ICD-10-CM | POA: Diagnosis not present

## 2020-02-12 DIAGNOSIS — A4189 Other specified sepsis: Secondary | ICD-10-CM | POA: Diagnosis not present

## 2020-02-12 DIAGNOSIS — U071 COVID-19: Secondary | ICD-10-CM | POA: Diagnosis not present

## 2020-02-12 LAB — POCT I-STAT 7, (LYTES, BLD GAS, ICA,H+H)
Acid-Base Excess: 13 mmol/L — ABNORMAL HIGH (ref 0.0–2.0)
Bicarbonate: 40.4 mmol/L — ABNORMAL HIGH (ref 20.0–28.0)
Calcium, Ion: 1.13 mmol/L — ABNORMAL LOW (ref 1.15–1.40)
HCT: 40 % (ref 39.0–52.0)
Hemoglobin: 13.6 g/dL (ref 13.0–17.0)
O2 Saturation: 97 %
Patient temperature: 97.6
Potassium: 4.9 mmol/L (ref 3.5–5.1)
Sodium: 147 mmol/L — ABNORMAL HIGH (ref 135–145)
TCO2: 42 mmol/L — ABNORMAL HIGH (ref 22–32)
pCO2 arterial: 64 mmHg — ABNORMAL HIGH (ref 32.0–48.0)
pH, Arterial: 7.406 (ref 7.350–7.450)
pO2, Arterial: 88 mmHg (ref 83.0–108.0)

## 2020-02-12 LAB — CULTURE, BLOOD (ROUTINE X 2)

## 2020-02-12 LAB — GLUCOSE, CAPILLARY
Glucose-Capillary: 154 mg/dL — ABNORMAL HIGH (ref 70–99)
Glucose-Capillary: 132 mg/dL — ABNORMAL HIGH (ref 70–99)
Glucose-Capillary: 139 mg/dL — ABNORMAL HIGH (ref 70–99)
Glucose-Capillary: 135 mg/dL — ABNORMAL HIGH (ref 70–99)
Glucose-Capillary: 141 mg/dL — ABNORMAL HIGH (ref 70–99)
Glucose-Capillary: 196 mg/dL — ABNORMAL HIGH (ref 70–99)

## 2020-02-12 LAB — FERRITIN: Ferritin: 726 ng/mL — ABNORMAL HIGH (ref 24–336)

## 2020-02-12 LAB — C-REACTIVE PROTEIN: CRP: 2.5 mg/dL — ABNORMAL HIGH (ref <1.0)

## 2020-02-12 NOTE — Progress Notes (Signed)
RT and RN turned pt's head.

## 2020-02-12 NOTE — Progress Notes (Signed)
Assisted tele visit to patient with wife, Darl Pikes.  Vena Austria, RN

## 2020-02-12 NOTE — Progress Notes (Signed)
NAME:  Gregory Harrington, MRN:  [id number], DOB:  [birth year], LOS: 4 ADMISSION DATE:  03/01/2020, CONSULTATION DATE:  02/12/20 REFERRING MD: Lynelle Doctor , CHIEF COMPLAINT:  Hypoxia   Brief History   64 y.o. M with PMH of COPD who was diagnosed with Covid-19 on 3/26, presented with worsening hypoxia and multi-focal PNA requiring intubation.   Past Medical History   has a past medical history of COPD (chronic obstructive pulmonary disease) (HCC). Tobacco use (quit 2007)  Significant Hospital Events   4/4 Admit to PCCM and initiate proning  4/5 paralyse & prone 4/7-remains prone, hypoxemic  Consults:    Procedures:  4/4 ETT >> 4/5 RT fem CVL >>  4/6 echo >>nml LV function  Micro Data:  4/4 POC Covid-19>>negative 4/4 BCx2>> MSSE 2/2 4/5 resp >> rare gram positive cocci, rare gram-positive rods  Antimicrobials:  Azithromycin 4/4 only Ceftriaxone 4/4 >>  Vanc 4/5 >>   Interim history/subjective:   Critically ill, intubated, prone Paralyzed  Objective   Blood pressure 115/75, pulse 63, temperature 97.6 F (36.4 C), temperature source Axillary, resp. rate (!) 32, height 5\' 11"  (1.803 m), weight 110.3 kg, SpO2 94 %.    Vent Mode: PRVC FiO2 (%):  [100 %] 100 % Set Rate:  [32 bmp] 32 bmp Vt Set:  [450 mL] 450 mL PEEP:  [20 cmH20] 20 cmH20 Plateau Pressure:  [30 cmH20-32 cmH20] 31 cmH20   Intake/Output Summary (Last 24 hours) at 02/12/2020 1011 Last data filed at 02/12/2020 1000 Gross per 24 hour  Intake 3067.86 ml  Output 1280 ml  Net 1787.86 ml   Filed Weights   02/10/20 0500 02/11/20 0451 02/12/20 0401  Weight: 107.2 kg 109.1 kg 110.3 kg   General: Obese, sedated, prone  HEENT: Endotracheal tube in place Neuro: RASS -5, paralyzed CV: S1-S2 appreciated PULM: Bilateral breath sounds appreciated posteriorly GI: Prone Extremities: 1+ peripheral edema Skin: no rashes or lesions  Chest x-ray 4/7-bilateral infiltrate unchanged-reviewed by myself   Labs ABG 7.4  4/62/58 Hypernatremia  Resolved Hospital Problem list     Assessment & Plan:   ARDS secondary to Covid-19 PNA P: Continue mechanical ventilation per ARDS protocol Target TVol 6-8cc/kgIBW Target Plateau Pressure < 30cm H20 Target driving pressure less than 15 cm of water Target PaO2 55-65: titrate PEEP/FiO2 per protocol As long as PaO2 to FiO2 ratio is less than 1:150 position in prone position for 16 hours a day Prone alternate with supine 16 hours / 8 hours  COVID-19 pneumonia -On dexamethasone -On remdesivir -S/p Actemra -Scheduled neb bronchodilators  MSSE bacteremia 2/2 -Currently on Ancef-day 3 -Significant leukocytosis initially, this is trending better -Respiratory cultures not revealing at present  Acute encephalopathy -Deep sedation with RASS goal of -5 -On fentanyl and Versed gtt.  Acute kidney injury -Current creatinine of 1.26 -Avoid nephrotoxic's -Continue to trend  Hypernatremia -Free water -Add D5 -Improving  Hyperkalemia -Lokelma was given  Acute encephalopathy/need for paralysis -Goal for deep sedation, RASS -5 -Continue fentanyl and Versed gtt  Elevated transaminases -History of liver failure in the past but did return to baseline -No history of EtOH use -Trend LFTs  Goals of care was discussed on 4/6-discussed with wife 03-13-1976, explained mortality of severe ARDS.  His baseline has been deteriorating due to COPD and as such he does not seem to be a good ECMO candidate.  He would not want to be "hooked up to machines".  He would prefer quality over quantity of life.  We decided to issue a  limited CODE STATUS with no CPR in case of cardiac arrest but continue full medical care.  She would be amenable to transition to full comfort if his condition were considered "hopeless"  Best practice:  Diet: Tube feeds Pain/Anxiety/Delirium protocol (if indicated): Fentanyl, versed VAP protocol (if indicated): HOB to 30 degrees, daily SBT DVT  prophylaxis: Lovenox GI prophylaxis: Protonix Glucose control: SSI Mobility: bed rest Code Status: full code Family Communication: wife updated 02/12/2020 Disposition: ICU  The patient is critically ill with multiple organ systems failure and requires high complexity decision making for assessment and support, frequent evaluation and titration of therapies, application of advanced monitoring technologies and extensive interpretation of multiple databases. Critical Care Time devoted to patient care services described in this note independent of APP/resident time (if applicable)  is 32 minutes.   Sherrilyn Rist MD [facility] Personal pager: [phone] If unanswered, please page CCM On-call: [phone]

## 2020-02-12 NOTE — Progress Notes (Signed)
RT note: RT and RN's placed prone barriers in place and RT taped et tube. Patient proned at this time.

## 2020-02-12 NOTE — Progress Notes (Signed)
RT note: RT and RN's un proned patient per protocol. No facial break down note. A small tear on his upper lip was observed but not bleeding at this time.

## 2020-02-12 NOTE — Progress Notes (Signed)
Assisted tele visit to patient with son & grandson.  Rikki Spearing, RN

## 2020-02-12 NOTE — Progress Notes (Signed)
RT and RN's turned pt head at this time. 

## 2020-02-12 NOTE — Progress Notes (Signed)
Assisted tele visit to patient with friend.  Aston Lieske M Desia Saban, RN   

## 2020-02-13 ENCOUNTER — Inpatient Hospital Stay (HOSPITAL_COMMUNITY): Payer: BC Managed Care – PPO

## 2020-02-13 DIAGNOSIS — U071 COVID-19: Secondary | ICD-10-CM | POA: Diagnosis not present

## 2020-02-13 DIAGNOSIS — R0902 Hypoxemia: Secondary | ICD-10-CM | POA: Diagnosis not present

## 2020-02-13 DIAGNOSIS — Z4682 Encounter for fitting and adjustment of non-vascular catheter: Secondary | ICD-10-CM | POA: Diagnosis not present

## 2020-02-13 DIAGNOSIS — A4189 Other specified sepsis: Secondary | ICD-10-CM | POA: Diagnosis not present

## 2020-02-13 DIAGNOSIS — R918 Other nonspecific abnormal finding of lung field: Secondary | ICD-10-CM | POA: Diagnosis not present

## 2020-02-13 DIAGNOSIS — J80 Acute respiratory distress syndrome: Secondary | ICD-10-CM | POA: Diagnosis not present

## 2020-02-13 DIAGNOSIS — J9602 Acute respiratory failure with hypercapnia: Secondary | ICD-10-CM | POA: Diagnosis not present

## 2020-02-13 DIAGNOSIS — J9601 Acute respiratory failure with hypoxia: Secondary | ICD-10-CM | POA: Diagnosis not present

## 2020-02-13 LAB — BASIC METABOLIC PANEL
Anion gap: 7 (ref 5–15)
BUN: 72 mg/dL — ABNORMAL HIGH (ref 8–23)
CO2: 36 mmol/L — ABNORMAL HIGH (ref 22–32)
Calcium: 8 mg/dL — ABNORMAL LOW (ref 8.9–10.3)
Chloride: 107 mmol/L (ref 98–111)
Creatinine, Ser: 1.06 mg/dL (ref 0.61–1.24)
GFR calc Af Amer: >60 mL/min (ref >60)
GFR calc non Af Amer: >60 mL/min (ref >60)
Glucose, Bld: 143 mg/dL — ABNORMAL HIGH (ref 70–99)
Potassium: 5.1 mmol/L (ref 3.5–5.1)
Sodium: 150 mmol/L — ABNORMAL HIGH (ref 135–145)

## 2020-02-13 LAB — CBC WITH DIFFERENTIAL/PLATELET
Abs Immature Granulocytes: 0.08 10*3/uL — ABNORMAL HIGH (ref 0.00–0.07)
Basophils Absolute: 0 10*3/uL (ref 0.0–0.1)
Basophils Relative: 0 %
Eosinophils Absolute: 0.1 10*3/uL (ref 0.0–0.5)
Eosinophils Relative: 1 %
HCT: 44.9 % (ref 39.0–52.0)
Hemoglobin: 13.3 g/dL (ref 13.0–17.0)
Immature Granulocytes: 1 %
Lymphocytes Relative: 9 %
Lymphs Abs: 1 10*3/uL (ref 0.7–4.0)
MCH: 31.5 pg (ref 26.0–34.0)
MCHC: 29.6 g/dL — ABNORMAL LOW (ref 30.0–36.0)
MCV: 106.4 fL — ABNORMAL HIGH (ref 80.0–100.0)
Monocytes Absolute: 0.7 10*3/uL (ref 0.1–1.0)
Monocytes Relative: 7 %
Neutro Abs: 9 10*3/uL — ABNORMAL HIGH (ref 1.7–7.7)
Neutrophils Relative %: 82 %
Platelets: 180 10*3/uL (ref 150–400)
RBC: 4.22 MIL/uL (ref 4.22–5.81)
RDW: 14.9 % (ref 11.5–15.5)
WBC: 10.9 10*3/uL — ABNORMAL HIGH (ref 4.0–10.5)
nRBC: 0.5 % — ABNORMAL HIGH (ref 0.0–0.2)

## 2020-02-13 LAB — GLUCOSE, CAPILLARY
Glucose-Capillary: 166 mg/dL — ABNORMAL HIGH (ref 70–99)
Glucose-Capillary: 146 mg/dL — ABNORMAL HIGH (ref 70–99)
Glucose-Capillary: 126 mg/dL — ABNORMAL HIGH (ref 70–99)
Glucose-Capillary: 146 mg/dL — ABNORMAL HIGH (ref 70–99)
Glucose-Capillary: 144 mg/dL — ABNORMAL HIGH (ref 70–99)
Glucose-Capillary: 153 mg/dL — ABNORMAL HIGH (ref 70–99)

## 2020-02-13 LAB — C-REACTIVE PROTEIN: CRP: 1 mg/dL — ABNORMAL HIGH (ref <1.0)

## 2020-02-13 LAB — FERRITIN: Ferritin: 604 ng/mL — ABNORMAL HIGH (ref 24–336)

## 2020-02-13 NOTE — Progress Notes (Signed)
RT and RN's turned pt's head at this time. 

## 2020-02-13 NOTE — Progress Notes (Signed)
Patient supined at 1000. After the flip RN noticed massive airleak from ETT. RT called to bedside and unable to resolve. MD at bedside to exchange ETT. VSS stable throughout.

## 2020-02-13 NOTE — Progress Notes (Signed)
Patient proned.  SPO2 90%

## 2020-02-13 NOTE — Progress Notes (Signed)
Pt supined at 10:00.  Airleak noticed around ETT.  MD changed ETT with Bougie.  BBS, SPO2 86%, Awaiting CXR

## 2020-02-13 NOTE — Progress Notes (Signed)
Blown endotracheal tube cough  Reintubated with a size 8 ET tube uneventfully

## 2020-02-13 NOTE — Progress Notes (Signed)
NAME:  Gregory Harrington, MRN:  [id number], DOB:  06-16-1956, LOS: 5 ADMISSION DATE:  03-Mar-2020, CONSULTATION DATE:  02/13/20 REFERRING MD: Tomi Bamberger , CHIEF COMPLAINT:  Hypoxia   Brief History   64 y.o. M with PMH of COPD who was diagnosed with Covid-19 on 3/26, presented with worsening hypoxia and multi-focal PNA requiring intubation.   Past Medical History   has a past medical history of COPD (chronic obstructive pulmonary disease) ([place]). Tobacco use (quit 2007)  Significant Hospital Events   4/4 Admit to PCCM and initiate proning  4/5 paralyse & prone 4/7-remains prone, hypoxemic 4/9-proning  Consults:    Procedures:  4/4 ETT >> 4/5 RT fem CVL >>  4/6 echo >>nml LV function  Micro Data:  4/4 POC Covid-19>>negative 4/4 BCx2>> MSSE 2/2 4/5 resp >> rare gram positive cocci, rare gram-positive rods-normal respiratory flora  Antimicrobials:  Azithromycin 4/4 only Ceftriaxone 4/4 -4/5 Vanc 4/5  Ancef   Interim history/subjective:   Critically ill, intubated, prone Paralyzed Afebrile No overnight events  Objective   Blood pressure 123/69, pulse (!) 57, temperature 98 F (36.7 C), temperature source Axillary, resp. rate (!) 32, height 5\' 11"  (1.803 m), weight 110.2 kg, SpO2 96 %.    Vent Mode: PRVC FiO2 (%):  [100 %] 100 % Set Rate:  [32 bmp] 32 bmp Vt Set:  [450 mL] 450 mL PEEP:  [20 cmH20] 20 cmH20 Plateau Pressure:  [29 cmH20-30 cmH20] 30 cmH20   Intake/Output Summary (Last 24 hours) at 02/13/2020 0926 Last data filed at 02/13/2020 0900 Gross per 24 hour  Intake 2323.29 ml  Output 1655 ml  Net 668.29 ml   Filed Weights   02/11/20 0451 02/12/20 0401 02/13/20 0411  Weight: 109.1 kg 110.3 kg 110.2 kg   General: Obese, sedated, prone  HEENT: Endotracheal tube in place Neuro: RASS -5, paralyzed CV: S1-S2 appreciated PULM: Bilateral breath sounds appreciated posteriorly GI: Prone Extremities: 1+ peripheral edema Skin: no rashes or lesions  Chest x-ray 4/7-bilateral  infiltrate unchanged-reviewed by myself   Labs ABG 7.40/64/88 Hypernatremia  Resolved Hospital Problem list     Assessment & Plan:   ARDS secondary to Covid-19 PNA P: Continue mechanical ventilation per ARDS protocol Target TVol 6-8cc/kgIBW Target Plateau Pressure < 30cm H20 Target driving pressure less than 15 cm of water Target PaO2 55-65: titrate PEEP/FiO2 per protocol As long as PaO2 to FiO2 ratio is less than 1:150 position in prone position for 16 hours a day Prone alternate with supine 16 hours / 8 hours  COVID-19 pneumonia -On dexamethasone -remdesivir completed -S/p Actemra -Scheduled neb bronchodilators  MSSE bacteremia 2/2 -Currently on Ancef-day 4 -Significant leukocytosis initially, this is trending better -Respiratory cultures not revealing at present  Acute encephalopathy -Deep sedation with RASS goal of -5 -On fentanyl and Versed gtt. -On Nimbex  Acute kidney injury -Current creatinine of 1.26 -Avoid nephrotoxic's -Continue to trend  Hypernatremia -Free water -Improving  Hyperkalemia -Lokelma was given  Acute encephalopathy/need for paralysis -Goal for deep sedation, RASS -5 -Continue fentanyl and Versed gtt, cisatracurium for paralysis  Elevated transaminases -History of liver failure in the past but did return to baseline -No history of EtOH use -Trend LFTs  Labs pending this morning  Goals of care was discussed on 4/8-discussed with wife [name], explained mortality of severe ARDS.  His baseline has been deteriorating due to COPD and as such he does not seem to be a good ECMO candidate.  He would not want to be "hooked up to machines".  He would prefer quality over quantity of life.  We decided to issue a limited CODE STATUS with no CPR in case of cardiac arrest but continue full medical care.  She would be amenable to transition to full comfort if his condition were considered "hopeless"  Best practice:  Diet: Tube  feeds Pain/Anxiety/Delirium protocol (if indicated): Fentanyl, versed VAP protocol (if indicated): HOB to 30 degrees, daily SBT DVT prophylaxis: Lovenox GI prophylaxis: Protonix Glucose control: SSI Mobility: bed rest Code Status: full code Family Communication: wife updated 02/12/2020 Disposition: ICU  The patient is critically ill with multiple organ systems failure and requires high complexity decision making for assessment and support, frequent evaluation and titration of therapies, application of advanced monitoring technologies and extensive interpretation of multiple databases. Critical Care Time devoted to patient care services described in this note independent of APP/resident time (if applicable)  is 30 minutes.   Virl Diamond MD [place] [facility] Personal pager: [phone] If unanswered, please page CCM On-call: [phone]

## 2020-02-13 NOTE — Progress Notes (Signed)
RT and RN's turned pt head at this time. 

## 2020-02-13 NOTE — Progress Notes (Signed)
Assisted tele visit to patient with family member.  Tierany Appleby Ann, RN  

## 2020-02-13 NOTE — Procedures (Signed)
Intubation Procedure Note Gregory Harrington [id number] 09-Jun-1956  Procedure: Intubation Indications: Acute hypoxemic respiratory failure  Procedure Details Consent: Unable to obtain consent because of emergent medical necessity. Time Out: Verified patient identification, verified procedure, site/side was marked, verified correct patient position, special equipment/implants available, medications/allergies/relevent history reviewed, required imaging and test results available.  Performed  Maximum sterile technique was used including cap, gloves, gown, hand hygiene and mask.  MAC and 4   Size 8 endotracheal tube was successfully exchanged over a bougie  Evaluation Hemodynamic Status: BP stable throughout; O2 sats: stable throughout Patient's Current Condition: stable Complications: No apparent complications Patient did tolerate procedure well. Chest X-ray ordered to verify placement.  CXR: pending.   Gregory Harrington 02/13/2020

## 2020-02-13 NOTE — Progress Notes (Signed)
RT and RN's turned pt's head at this time.

## 2020-02-14 ENCOUNTER — Inpatient Hospital Stay (HOSPITAL_COMMUNITY): Payer: BC Managed Care – PPO

## 2020-02-14 DIAGNOSIS — R918 Other nonspecific abnormal finding of lung field: Secondary | ICD-10-CM | POA: Diagnosis not present

## 2020-02-14 DIAGNOSIS — U071 COVID-19: Secondary | ICD-10-CM | POA: Diagnosis not present

## 2020-02-14 DIAGNOSIS — A419 Sepsis, unspecified organism: Secondary | ICD-10-CM | POA: Diagnosis not present

## 2020-02-14 DIAGNOSIS — A4189 Other specified sepsis: Secondary | ICD-10-CM | POA: Diagnosis not present

## 2020-02-14 DIAGNOSIS — J9602 Acute respiratory failure with hypercapnia: Secondary | ICD-10-CM | POA: Diagnosis not present

## 2020-02-14 DIAGNOSIS — J9601 Acute respiratory failure with hypoxia: Secondary | ICD-10-CM | POA: Diagnosis not present

## 2020-02-14 LAB — GLUCOSE, CAPILLARY
Glucose-Capillary: 125 mg/dL — ABNORMAL HIGH (ref 70–99)
Glucose-Capillary: 130 mg/dL — ABNORMAL HIGH (ref 70–99)
Glucose-Capillary: 133 mg/dL — ABNORMAL HIGH (ref 70–99)
Glucose-Capillary: 153 mg/dL — ABNORMAL HIGH (ref 70–99)
Glucose-Capillary: 170 mg/dL — ABNORMAL HIGH (ref 70–99)
Glucose-Capillary: 191 mg/dL — ABNORMAL HIGH (ref 70–99)

## 2020-02-14 LAB — BASIC METABOLIC PANEL
Anion gap: 9 (ref 5–15)
BUN: 67 mg/dL — ABNORMAL HIGH (ref 8–23)
CO2: 34 mmol/L — ABNORMAL HIGH (ref 22–32)
Calcium: 8 mg/dL — ABNORMAL LOW (ref 8.9–10.3)
Chloride: 108 mmol/L (ref 98–111)
Creatinine, Ser: 1.01 mg/dL (ref 0.61–1.24)
GFR calc Af Amer: >60 mL/min (ref >60)
GFR calc non Af Amer: >60 mL/min (ref >60)
Glucose, Bld: 173 mg/dL — ABNORMAL HIGH (ref 70–99)
Potassium: 5.3 mmol/L — ABNORMAL HIGH (ref 3.5–5.1)
Sodium: 151 mmol/L — ABNORMAL HIGH (ref 135–145)

## 2020-02-14 LAB — CBC WITH DIFFERENTIAL/PLATELET
Abs Immature Granulocytes: 0.08 10*3/uL — ABNORMAL HIGH (ref 0.00–0.07)
Basophils Absolute: 0 10*3/uL (ref 0.0–0.1)
Basophils Relative: 0 %
Eosinophils Absolute: 0 10*3/uL (ref 0.0–0.5)
Eosinophils Relative: 0 %
HCT: 43.1 % (ref 39.0–52.0)
Hemoglobin: 12.8 g/dL — ABNORMAL LOW (ref 13.0–17.0)
Immature Granulocytes: 1 %
Lymphocytes Relative: 5 %
Lymphs Abs: 0.5 10*3/uL — ABNORMAL LOW (ref 0.7–4.0)
MCH: 31.6 pg (ref 26.0–34.0)
MCHC: 29.7 g/dL — ABNORMAL LOW (ref 30.0–36.0)
MCV: 106.4 fL — ABNORMAL HIGH (ref 80.0–100.0)
Monocytes Absolute: 0.5 10*3/uL (ref 0.1–1.0)
Monocytes Relative: 5 %
Neutro Abs: 8.7 10*3/uL — ABNORMAL HIGH (ref 1.7–7.7)
Neutrophils Relative %: 89 %
Platelets: 163 10*3/uL (ref 150–400)
RBC: 4.05 MIL/uL — ABNORMAL LOW (ref 4.22–5.81)
RDW: 14.8 % (ref 11.5–15.5)
WBC: 9.8 10*3/uL (ref 4.0–10.5)
nRBC: 0.2 % (ref 0.0–0.2)

## 2020-02-14 MED ORDER — FUROSEMIDE 10 MG/ML IJ SOLN
40.0000 mg | Freq: Once | INTRAMUSCULAR | Status: AC
  Administered 2020-02-14: 40 mg via INTRAVENOUS
  Filled 2020-02-14: qty 4

## 2020-02-14 NOTE — Progress Notes (Signed)
NAME:  Gregory Harrington, MRN:  [id number], DOB:  May 23, 1956, LOS: 6 ADMISSION DATE:  03/01/2020, CONSULTATION DATE:  02/14/20 REFERRING MD: Tomi Bamberger , CHIEF COMPLAINT:  Hypoxia   Brief History   64 y.o. M with PMH of COPD who was diagnosed with Covid-19 on 3/26, presented with worsening hypoxia and multi-focal PNA requiring intubation.   Past Medical History   has a past medical history of COPD (chronic obstructive pulmonary disease) ([place]). Tobacco use (quit 2007)  Significant Hospital Events   4/4 Admit to PCCM and initiate proning  4/5 paralyse & prone 4/7-remains prone, hypoxemic 4/9-proning 4/10-still requiring proning  Consults:    Procedures:  4/4 ETT >> 4/5 RT fem CVL >>  4/6 echo >>nml LV function  Micro Data:  4/4 POC Covid-19>>negative 4/4 BCx2>> MSSE 2/2 4/5 resp >> rare gram positive cocci, rare gram-positive rods-normal respiratory flora  Antimicrobials:  Azithromycin 4/4 only Ceftriaxone 4/4 -4/5 Vanc 4/5  Ancef   Interim history/subjective:   Critically ill, intubated, prone Paralyzed Afebrile No overnight events  Objective   Blood pressure 118/63, pulse (!) 56, temperature 98.7 F (37.1 C), temperature source Oral, resp. rate (!) 32, height 5\' 11"  (1.803 m), weight 111.8 kg, SpO2 96 %.    Vent Mode: PRVC FiO2 (%):  [90 %-100 %] 90 % Set Rate:  [32 bmp] 32 bmp Vt Set:  [450 mL] 450 mL PEEP:  [20 cmH20] 20 cmH20 Plateau Pressure:  [30 cmH20-31 cmH20] 31 cmH20   Intake/Output Summary (Last 24 hours) at 02/14/2020 0900 Last data filed at 02/14/2020 0800 Gross per 24 hour  Intake 1925.96 ml  Output 2225 ml  Net -299.04 ml   Filed Weights   02/12/20 0401 02/13/20 0411 02/14/20 0411  Weight: 110.3 kg 110.2 kg 111.8 kg   General: Obese, sedated, prone  HEENT: Endotracheal tube in place Neuro: RASS -5, paralyzed CV: Prone PULM: Bilateral breath sounds appreciated posteriorly, decreased GI: Prone Extremities: 1+ peripheral edema Skin: no rashes or  lesions  Chest x-ray 4/9-bilateral infiltrate unchanged-reviewed by myself   Labs ABG 7.38/68/66 Hypernatremia  Resolved Hospital Problem list     Assessment & Plan:   ARDS secondary to Covid-19 PNA P: Continue mechanical ventilation per ARDS protocol Target TVol 6-8cc/kgIBW Target Plateau Pressure < 30cm H20 Target driving pressure less than 15 cm of water Target PaO2 55-65: titrate PEEP/FiO2 per protocol As long as PaO2 to FiO2 ratio is less than 1:150 position in prone position for 16 hours a day Prone alternate with supine 16 hours / 8 hours  COVID-19 pneumonia -On dexamethasone -remdesivir completed -S/p Actemra -Scheduled neb bronchodilators  MSSE bacteremia 2/2 -Currently on Ancef-to stop today -Significant leukocytosis initially, improved -Respiratory cultures-negative   Acute encephalopathy -Deep sedation with RASS goal of -5 -On fentanyl and Versed gtt. -On Nimbex  Acute kidney injury -Current creatinine of 1.01 -Avoid nephrotoxic's -Continue to trend  Hypernatremia -Free water-will increase -Improving  Hyperkalemia -Lokelma was given  Acute encephalopathy/need for paralysis -Goal for deep sedation, RASS -5 -Continue fentanyl and Versed gtt, cisatracurium for paralysis  Elevated transaminases -History of liver failure in the past but did return to baseline -No history of EtOH use -Trend LFTs   VS stable Clinical status remains stable  Goals of care was discussed on 4/8-discussed with wife [name], explained mortality of severe ARDS.  His baseline has been deteriorating due to COPD and as such he does not seem to be a good ECMO candidate.  He would not want to be "hooked  up to machines".  He would prefer quality over quantity of life.  We decided to issue a limited CODE STATUS with no CPR in case of cardiac arrest but continue full medical care.  She would be amenable to transition to full comfort if his condition were considered  "hopeless"  Best practice:  Diet: Tube feeds Pain/Anxiety/Delirium protocol (if indicated): Fentanyl, versed VAP protocol (if indicated): HOB to 30 degrees, daily SBT DVT prophylaxis: Lovenox GI prophylaxis: Protonix Glucose control: SSI Mobility: bed rest Code Status: full code Family Communication: wife updated 02/12/2020 Disposition: ICU  The patient is critically ill with multiple organ systems failure and requires high complexity decision making for assessment and support, frequent evaluation and titration of therapies, application of advanced monitoring technologies and extensive interpretation of multiple databases. Critical Care Time devoted to patient care services described in this note independent of APP/resident time (if applicable)  is 32 minutes.   Virl Diamond MD [facility] Personal pager: [phone] If unanswered, please page CCM On-call: #[phone]

## 2020-02-14 NOTE — Progress Notes (Signed)
Called and updated Gregory Harrington about [place] status

## 2020-02-14 NOTE — Progress Notes (Signed)
Wife called and updated on patient status and plan of care. Updated on new increased periorbital swelling, that the patient will not be going back to the prone position today, and diuresis. All questioned answered at this time.

## 2020-02-14 NOTE — Progress Notes (Signed)
Assisted tele visit to patient with family member.  Mecca Guitron P, RN  

## 2020-02-14 NOTE — Progress Notes (Signed)
Pt placed supine at this time with no complications. VS within normal limits. Pt noted to have significant swelling around eyes. MD made aware. Will take proning holiday for 24 hours. Pt ett secured with commercial tube holder after tape removed. Pt noted to have small abrasion to right cheek and was covered with mepilex prior to placing tube holder. Pt also noted to have small blister on bottom left lip. Will avoid moving ett to left at this time. MD made aware. RN at bedside and made aware as well.

## 2020-02-14 NOTE — Progress Notes (Signed)
Assisted tele visit to patient with family member.  Charnise Lovan P, RN  

## 2020-02-14 NOTE — Progress Notes (Signed)
Noted to have significant facial swelling when he was transitioned to supine position  Will give him a proning holiday today

## 2020-02-15 DIAGNOSIS — J1282 Pneumonia due to coronavirus disease 2019: Secondary | ICD-10-CM

## 2020-02-15 LAB — GLUCOSE, CAPILLARY
Glucose-Capillary: 112 mg/dL — ABNORMAL HIGH (ref 70–99)
Glucose-Capillary: 133 mg/dL — ABNORMAL HIGH (ref 70–99)
Glucose-Capillary: 151 mg/dL — ABNORMAL HIGH (ref 70–99)
Glucose-Capillary: 161 mg/dL — ABNORMAL HIGH (ref 70–99)
Glucose-Capillary: 186 mg/dL — ABNORMAL HIGH (ref 70–99)
Glucose-Capillary: 186 mg/dL — ABNORMAL HIGH (ref 70–99)

## 2020-02-15 LAB — HEPATIC FUNCTION PANEL
ALT: 19 U/L (ref 0–44)
AST: 28 U/L (ref 15–41)
Albumin: 2.4 g/dL — ABNORMAL LOW (ref 3.5–5.0)
Alkaline Phosphatase: 47 U/L (ref 38–126)
Bilirubin, Direct: 0.2 mg/dL (ref 0.0–0.2)
Indirect Bilirubin: 0.7 mg/dL (ref 0.3–0.9)
Total Bilirubin: 0.9 mg/dL (ref 0.3–1.2)
Total Protein: 5.4 g/dL — ABNORMAL LOW (ref 6.5–8.1)

## 2020-02-15 LAB — BASIC METABOLIC PANEL
Anion gap: 9 (ref 5–15)
BUN: 71 mg/dL — ABNORMAL HIGH (ref 8–23)
CO2: 34 mmol/L — ABNORMAL HIGH (ref 22–32)
Calcium: 8.3 mg/dL — ABNORMAL LOW (ref 8.9–10.3)
Chloride: 106 mmol/L (ref 98–111)
Creatinine, Ser: 1.03 mg/dL (ref 0.61–1.24)
GFR calc Af Amer: >60 mL/min (ref >60)
GFR calc non Af Amer: >60 mL/min (ref >60)
Glucose, Bld: 126 mg/dL — ABNORMAL HIGH (ref 70–99)
Potassium: 5.2 mmol/L — ABNORMAL HIGH (ref 3.5–5.1)
Sodium: 149 mmol/L — ABNORMAL HIGH (ref 135–145)

## 2020-02-15 NOTE — Progress Notes (Signed)
NAME:  Gregory Harrington, MRN:  [id number], DOB:  03-22-1956, LOS: 7 ADMISSION DATE:  02/14/2020, CONSULTATION DATE:  02/15/20 REFERRING MD: Gregory Harrington , CHIEF COMPLAINT:  Hypoxia   Brief History   64 y.o. M with PMH of COPD who was diagnosed with Covid-19 on 3/26, presented with worsening hypoxia and multi-focal PNA requiring intubation.   Past Medical History   has a past medical history of COPD (chronic obstructive pulmonary disease) (HCC). Tobacco use (quit 2007)  Significant Hospital Events   4/4 Admit to PCCM and initiate proning  4/5 paralyse & prone 4/7-remains prone, hypoxemic 4/9-proning 4/10-still requiring proning 4/11-proning holiday yesterday Consults:    Procedures:  4/4 ETT >> 4/5 RT fem CVL >>  4/6 echo >>nml LV function  Micro Data:  4/4 POC Covid-19>>negative 4/4 BCx2>> MSSE 2/2 4/5 resp >> rare gram positive cocci, rare gram-positive rods-normal respiratory flora  Antimicrobials:  Azithromycin 4/4 only Ceftriaxone 4/4 -4/5 Vanc 4/5  Ancef   Interim history/subjective:   No overnight events  Objective   Blood pressure (!) 148/72, pulse 65, temperature 98.1 F (36.7 C), temperature source Oral, resp. rate (!) 31, height 5\' 11"  (1.803 m), weight 110.6 kg, SpO2 95 %.    Vent Mode: PRVC FiO2 (%):  [65 %-90 %] 65 % Set Rate:  [32 bmp] 32 bmp Vt Set:  [450 mL] 450 mL PEEP:  [20 cmH20] 20 cmH20 Plateau Pressure:  [29 cmH20-31 cmH20] 29 cmH20   Intake/Output Summary (Last 24 hours) at 02/15/2020 0909 Last data filed at 02/15/2020 0615 Gross per 24 hour  Intake 3159.53 ml  Output 1900 ml  Net 1259.53 ml   Filed Weights   02/13/20 0411 02/14/20 0411 02/15/20 0500  Weight: 110.2 kg 111.8 kg 110.6 kg   General: Obese, sedated, prone  HEENT: Endotracheal tube in place Neuro: RASS -5, paralyzed CV: S1-S2 appreciated PULM: Bilateral breath sounds appreciated, no rales GI: Bowel sounds appreciated Extremities: 1+ peripheral edema Skin: no rashes or  lesions  Chest x-ray 4/9-bilateral infiltrate unchanged-reviewed by myself   Labs ABG 7.38/68/66  Hypernatremia  Resolved Hospital Problem list     Assessment & Plan:   ARDS secondary to Covid-19 PNA P: Continue mechanical ventilation per ARDS protocol Target TVol 6-8cc/kgIBW Target Plateau Pressure < 30cm H20 Target driving pressure less than 15 cm of water Target PaO2 55-65: titrate PEEP/FiO2 per protocol -Required to prone all the day on 4/10 secondary to facial swelling Currently on 65% FiO2  COVID-19 pneumonia -On dexamethasone -remdesivir completed -S/p Actemra -Scheduled neb bronchodilators  MSSE bacteremia 2/2 -Completed Ancef -Significant leukocytosis initially, improved -Respiratory cultures-negative   Acute encephalopathy -Deep sedation with RASS goal of -5 -On fentanyl and Versed gtt. -Discontinue Nimbex today  Acute kidney injury -Current creatinine of 1.01 -Avoid nephrotoxic's -Continue to trend  Hypernatremia -Free water-will increase -Improving  Hyperkalemia -Lokelma was given  -Continue to trend electrolytes  Elevated transaminases -History of liver failure in the past but did return to baseline -No history of EtOH use -Trend LFTs   VS stable Clinical status remains stable  Goals of care was discussed on 4/8-discussed with wife Gregory Harrington, explained mortality of severe ARDS.  His baseline has been deteriorating due to COPD and as such he does not seem to be a good ECMO candidate.  He would not want to be "hooked up to machines".  He would prefer quality over quantity of life.  We decided to issue a limited CODE STATUS with no CPR in case of cardiac arrest  but continue full medical care.  She would be amenable to transition to full comfort if his condition were considered "hopeless"   Discontinue paralytic-may be reinitiated as needed Hold off proning Continue to wean down FiO2 as tolerated  Best practice:  Diet: Tube  feeds Pain/Anxiety/Delirium protocol (if indicated): Fentanyl, versed VAP protocol (if indicated): HOB to 30 degrees, daily SBT DVT prophylaxis: Lovenox GI prophylaxis: Protonix Glucose control: SSI Mobility: bed rest Code Status: full code Family Communication: wife updated 02/14/2020 Disposition: ICU  The patient is critically ill with multiple organ systems failure and requires high complexity decision making for assessment and support, frequent evaluation and titration of therapies, application of advanced monitoring technologies and extensive interpretation of multiple databases. Critical Care Time devoted to patient care services described in this note independent of APP/resident time (if applicable)  is 35 minutes.   Gregory Rist MD [facility] Personal pager: [phone] If unanswered, please page CCM On-call: [phone]

## 2020-02-15 NOTE — Progress Notes (Signed)
During initial assessment of patient, dark red/purple area observed on patient's right upper lip. Area cleansed and gauze placed between lip and ETT holder. Wound care consult placed.

## 2020-02-15 NOTE — Progress Notes (Signed)
Assisted tele visit to patient with wife.  Kanan Sobek P, RN  

## 2020-02-16 ENCOUNTER — Inpatient Hospital Stay (HOSPITAL_COMMUNITY): Payer: BC Managed Care – PPO

## 2020-02-16 ENCOUNTER — Inpatient Hospital Stay: Payer: Self-pay

## 2020-02-16 DIAGNOSIS — A4189 Other specified sepsis: Secondary | ICD-10-CM | POA: Diagnosis not present

## 2020-02-16 DIAGNOSIS — J9601 Acute respiratory failure with hypoxia: Secondary | ICD-10-CM | POA: Diagnosis not present

## 2020-02-16 DIAGNOSIS — J9602 Acute respiratory failure with hypercapnia: Secondary | ICD-10-CM | POA: Diagnosis not present

## 2020-02-16 DIAGNOSIS — J9690 Respiratory failure, unspecified, unspecified whether with hypoxia or hypercapnia: Secondary | ICD-10-CM | POA: Diagnosis not present

## 2020-02-16 DIAGNOSIS — J80 Acute respiratory distress syndrome: Secondary | ICD-10-CM | POA: Diagnosis not present

## 2020-02-16 DIAGNOSIS — U071 COVID-19: Secondary | ICD-10-CM | POA: Diagnosis not present

## 2020-02-16 DIAGNOSIS — J181 Lobar pneumonia, unspecified organism: Secondary | ICD-10-CM | POA: Diagnosis not present

## 2020-02-16 LAB — CBC WITH DIFFERENTIAL/PLATELET
Abs Immature Granulocytes: 0.08 10*3/uL — ABNORMAL HIGH (ref 0.00–0.07)
Basophils Absolute: 0 10*3/uL (ref 0.0–0.1)
Basophils Relative: 0 %
Eosinophils Absolute: 0 10*3/uL (ref 0.0–0.5)
Eosinophils Relative: 0 %
HCT: 43.1 % (ref 39.0–52.0)
Hemoglobin: 12.7 g/dL — ABNORMAL LOW (ref 13.0–17.0)
Immature Granulocytes: 1 %
Lymphocytes Relative: 9 %
Lymphs Abs: 0.9 10*3/uL (ref 0.7–4.0)
MCH: 31.4 pg (ref 26.0–34.0)
MCHC: 29.5 g/dL — ABNORMAL LOW (ref 30.0–36.0)
MCV: 106.7 fL — ABNORMAL HIGH (ref 80.0–100.0)
Monocytes Absolute: 0.6 10*3/uL (ref 0.1–1.0)
Monocytes Relative: 6 %
Neutro Abs: 8.7 10*3/uL — ABNORMAL HIGH (ref 1.7–7.7)
Neutrophils Relative %: 84 %
Platelets: 161 10*3/uL (ref 150–400)
RBC: 4.04 MIL/uL — ABNORMAL LOW (ref 4.22–5.81)
RDW: 16.6 % — ABNORMAL HIGH (ref 11.5–15.5)
WBC: 10.3 10*3/uL (ref 4.0–10.5)
nRBC: 0 % (ref 0.0–0.2)

## 2020-02-16 LAB — BASIC METABOLIC PANEL
Anion gap: 8 (ref 5–15)
BUN: 75 mg/dL — ABNORMAL HIGH (ref 8–23)
CO2: 36 mmol/L — ABNORMAL HIGH (ref 22–32)
Calcium: 8.5 mg/dL — ABNORMAL LOW (ref 8.9–10.3)
Chloride: 104 mmol/L (ref 98–111)
Creatinine, Ser: 1.04 mg/dL (ref 0.61–1.24)
GFR calc Af Amer: >60 mL/min (ref >60)
GFR calc non Af Amer: >60 mL/min (ref >60)
Glucose, Bld: 168 mg/dL — ABNORMAL HIGH (ref 70–99)
Potassium: 5.2 mmol/L — ABNORMAL HIGH (ref 3.5–5.1)
Sodium: 148 mmol/L — ABNORMAL HIGH (ref 135–145)

## 2020-02-16 LAB — POCT I-STAT 7, (LYTES, BLD GAS, ICA,H+H)
Acid-Base Excess: 8 mmol/L — ABNORMAL HIGH (ref 0.0–2.0)
Bicarbonate: 36.8 mmol/L — ABNORMAL HIGH (ref 20.0–28.0)
Calcium, Ion: 1.23 mmol/L (ref 1.15–1.40)
HCT: 35 % — ABNORMAL LOW (ref 39.0–52.0)
Hemoglobin: 11.9 g/dL — ABNORMAL LOW (ref 13.0–17.0)
O2 Saturation: 91 %
Potassium: 4.6 mmol/L (ref 3.5–5.1)
Sodium: 147 mmol/L — ABNORMAL HIGH (ref 135–145)
TCO2: 39 mmol/L — ABNORMAL HIGH (ref 22–32)
pCO2 arterial: 71.5 mmHg (ref 32.0–48.0)
pH, Arterial: 7.319 — ABNORMAL LOW (ref 7.350–7.450)
pO2, Arterial: 69 mmHg — ABNORMAL LOW (ref 83.0–108.0)

## 2020-02-16 LAB — TRIGLYCERIDES: Triglycerides: 298 mg/dL — ABNORMAL HIGH (ref <150)

## 2020-02-16 LAB — GLUCOSE, CAPILLARY
Glucose-Capillary: 118 mg/dL — ABNORMAL HIGH (ref 70–99)
Glucose-Capillary: 128 mg/dL — ABNORMAL HIGH (ref 70–99)
Glucose-Capillary: 142 mg/dL — ABNORMAL HIGH (ref 70–99)
Glucose-Capillary: 148 mg/dL — ABNORMAL HIGH (ref 70–99)
Glucose-Capillary: 156 mg/dL — ABNORMAL HIGH (ref 70–99)
Glucose-Capillary: 181 mg/dL — ABNORMAL HIGH (ref 70–99)

## 2020-02-16 MED ORDER — SODIUM CHLORIDE 0.9 % IV SOLN
0.0000 ug/kg/min | INTRAVENOUS | Status: DC
  Administered 2020-02-16 – 2020-02-17 (×3): 3 ug/kg/min via INTRAVENOUS
  Administered 2020-02-18 – 2020-02-19 (×2): 2 ug/kg/min via INTRAVENOUS
  Administered 2020-02-20: 3 ug/kg/min via INTRAVENOUS
  Filled 2020-02-16 (×9): qty 20

## 2020-02-16 MED ORDER — POLYETHYLENE GLYCOL 3350 17 G PO PACK
17.0000 g | PACK | Freq: Every day | ORAL | Status: DC
  Administered 2020-02-16 – 2020-02-17 (×2): 17 g via ORAL
  Filled 2020-02-16 (×2): qty 1

## 2020-02-16 MED ORDER — PROPOFOL 1000 MG/100ML IV EMUL
5.0000 ug/kg/min | INTRAVENOUS | Status: DC
  Administered 2020-02-16: 5 ug/kg/min via INTRAVENOUS
  Administered 2020-02-17 (×2): 20 ug/kg/min via INTRAVENOUS
  Administered 2020-02-18: 30 ug/kg/min via INTRAVENOUS
  Administered 2020-02-18: 20 ug/kg/min via INTRAVENOUS
  Administered 2020-02-19: 25 ug/kg/min via INTRAVENOUS
  Administered 2020-02-19 (×2): 30 ug/kg/min via INTRAVENOUS
  Administered 2020-02-19 – 2020-02-21 (×7): 25 ug/kg/min via INTRAVENOUS
  Administered 2020-02-21: 50 ug/kg/min via INTRAVENOUS
  Administered 2020-02-21: 25 ug/kg/min via INTRAVENOUS
  Administered 2020-02-22 (×2): 40 ug/kg/min via INTRAVENOUS
  Administered 2020-02-22: 25 ug/kg/min via INTRAVENOUS
  Administered 2020-02-22 – 2020-02-23 (×2): 40 ug/kg/min via INTRAVENOUS
  Administered 2020-02-23: 50 ug/kg/min via INTRAVENOUS
  Administered 2020-02-23: 40 ug/kg/min via INTRAVENOUS
  Administered 2020-02-23: 30 ug/kg/min via INTRAVENOUS
  Administered 2020-02-23: 40 ug/kg/min via INTRAVENOUS
  Administered 2020-02-23: 60 ug/kg/min via INTRAVENOUS
  Administered 2020-02-23: 40 ug/kg/min via INTRAVENOUS
  Administered 2020-02-24 (×2): 70 ug/kg/min via INTRAVENOUS
  Administered 2020-02-24: 60 ug/kg/min via INTRAVENOUS
  Administered 2020-02-24: 80 ug/kg/min via INTRAVENOUS
  Filled 2020-02-16 (×10): qty 100
  Filled 2020-02-16: qty 1400
  Filled 2020-02-16 (×24): qty 100

## 2020-02-16 MED ORDER — BACITRACIN ZINC 500 UNIT/GM EX OINT
TOPICAL_OINTMENT | Freq: Two times a day (BID) | CUTANEOUS | Status: AC
  Filled 2020-02-16: qty 28.4

## 2020-02-16 MED ORDER — SODIUM CHLORIDE 0.9% FLUSH
10.0000 mL | INTRAVENOUS | Status: DC | PRN
  Administered 2020-02-22: 10 mL

## 2020-02-16 MED ORDER — SODIUM CHLORIDE 0.9% FLUSH: 10.0000 mL | INTRAVENOUS | Status: DC | PRN

## 2020-02-16 MED ORDER — ARTIFICIAL TEARS OPHTHALMIC OINT
1.0000 "application " | TOPICAL_OINTMENT | Freq: Three times a day (TID) | OPHTHALMIC | Status: DC
  Administered 2020-02-16 – 2020-02-24 (×25): 1 via OPHTHALMIC
  Filled 2020-02-16 (×2): qty 3.5

## 2020-02-16 MED ORDER — CISATRACURIUM BOLUS VIA INFUSION
0.0500 mg/kg | Freq: Once | INTRAVENOUS | Status: AC
  Administered 2020-02-16: 4.7 mg via INTRAVENOUS
  Filled 2020-02-16: qty 5

## 2020-02-16 MED ORDER — DOCUSATE SODIUM 50 MG/5ML PO LIQD
100.0000 mg | Freq: Two times a day (BID) | ORAL | Status: DC
  Administered 2020-02-17: 100 mg via ORAL
  Filled 2020-02-16: qty 10

## 2020-02-16 MED ORDER — BISACODYL 10 MG RE SUPP
10.0000 mg | Freq: Once | RECTAL | Status: AC
  Administered 2020-02-16: 10 mg via RECTAL
  Filled 2020-02-16: qty 1

## 2020-02-16 MED ORDER — SODIUM CHLORIDE 0.9% FLUSH
10.0000 mL | Freq: Two times a day (BID) | INTRAVENOUS | Status: DC
  Administered 2020-02-16 – 2020-02-23 (×13): 10 mL

## 2020-02-16 NOTE — Consult Note (Addendum)
WOC Nurse Consult Note: Reason for Consult: Deep tissue pressure injuries (DTPI) to mouth x 2. Right upper lip and left lower lip. Wound type:Pressure Pressure Injury POA: No Measurement: Left lower lip: circular blood blister measuring 0.2cm round  Right upper lip: 0.8cm x 1cm purple/maroon discoloration  Wound bed: As described above Drainage (amount, consistency, odor) None Periwound: edematous Dressing procedure/placement/frequency: Injuries assessed with Bedside RN, V. Bunner. POC implemented for twice daily application of bacitracin ointment with no dressing.   No skin breakdown on sacrum, buttocks or heels.  Sacral foam and bilateral Prevalon Boots are in place.  WOC nursing team will follow, and will remain available to this patient, the nursing and medical teams, seeing every 7-10 days until areas resolve.    Thanks, Ladona Mow, MSN, RN, GNP, [place]  Pager# [phone]

## 2020-02-16 NOTE — Progress Notes (Signed)
Pt proned without any major complications.  ETT secure with cloth tape and mepalex applied to protect skin. Head and arms re-adjusted.

## 2020-02-16 NOTE — Progress Notes (Signed)
Constipation  Start Colace Miralax Dulcolax X1

## 2020-02-16 NOTE — Progress Notes (Signed)
Peripherally Inserted Central Catheter Placement  The IV Nurse has discussed with the patient and/or persons authorized to consent for the patient, the purpose of this procedure and the potential benefits and risks involved with this procedure.  The benefits include less needle sticks, lab draws from the catheter, and the patient may be discharged home with the catheter. Risks include, but not limited to, infection, bleeding, blood clot (thrombus formation), and puncture of an artery; nerve damage and irregular heartbeat and possibility to perform a PICC exchange if needed/ordered by physician.  Alternatives to this procedure were also discussed.  Bard Power PICC patient education guide, fact sheet on infection prevention and patient information card has been provided to patient /or left at bedside.    PICC Placement Documentation  PICC Triple Lumen 02/16/20 PICC Right Basilic 43 cm 0 cm (Active)  Indication for Insertion or Continuance of Line Vasoactive infusions 02/16/20 1219  Exposed Catheter (cm) 0 cm 02/16/20 1219  Site Assessment Clean;Dry;Intact 02/16/20 1219  Lumen #1 Status Flushed;Saline locked;Blood return noted 02/16/20 1219  Lumen #2 Status Flushed;Saline locked;Blood return noted 02/16/20 1219  Lumen #3 Status Flushed;Saline locked;Blood return noted 02/16/20 1219  Dressing Type Transparent;Securing device 02/16/20 1219  Dressing Status Clean;Dry;Intact;Antimicrobial disc in place 02/16/20 1219  Dressing Intervention New dressing 02/16/20 1219  Dressing Change Due 02/23/20 02/16/20 1219       Annett Fabian 02/16/2020, 12:21 PM

## 2020-02-16 NOTE — Progress Notes (Signed)
Pt's head turned to the right and arms re-positioned. No complications.

## 2020-02-16 NOTE — Progress Notes (Signed)
NAME:  Gregory Harrington, MRN:  [id number], DOB:  [birth year], LOS: 8 ADMISSION DATE:  02/12/2020, CONSULTATION DATE:  02/16/20 REFERRING MD: Tomi Bamberger , CHIEF COMPLAINT:  Hypoxia   Brief History   64 y.o. M with PMH of COPD who was diagnosed with Covid-19 on 3/26, presented with worsening hypoxia and multi-focal PNA requiring intubation.   Past Medical History   has a past medical history of COPD (chronic obstructive pulmonary disease) ([place]). Tobacco use (quit 2007)  Significant Hospital Events   4/4 Admit to PCCM and initiate proning  4/5 paralyse & prone 4/7-remains prone, hypoxemic 4/9-proning 4/10-still requiring proning 4/11-proning holiday yesterday  Consults:    Procedures:  4/4 ETT >> 4/5 RT fem CVL >>  4/6 echo >>nml LV function  Micro Data:  4/4 POC Covid-19>>negative 4/4 BCx2>> MSSE 2/2 4/5 resp >> rare gram positive cocci, rare gram-positive rods-normal respiratory flora  Antimicrobials:  Azithromycin 4/4 only Ceftriaxone 4/4 -4/5 Vanc 4/5  Ancef   Interim history/subjective:   No overnight events requiring 90% FiO2  Objective   Blood pressure (!) 157/78, pulse 70, temperature 98.5 F (36.9 C), temperature source Axillary, resp. rate 19, height 5\' 11"  (1.803 m), weight 93.7 kg, SpO2 91 %.    Vent Mode: PRVC FiO2 (%):  [70 %-90 %] 90 % Set Rate:  [32 bmp] 32 bmp Vt Set:  [450 mL] 450 mL PEEP:  [20 cmH20] 20 cmH20 Plateau Pressure:  [28 cmH20-30 cmH20] 30 cmH20   Intake/Output Summary (Last 24 hours) at 02/16/2020 0900 Last data filed at 02/16/2020 0800 Gross per 24 hour  Intake 2976.57 ml  Output 2175 ml  Net 801.57 ml   Filed Weights   02/14/20 0411 02/15/20 0500 02/16/20 0500  Weight: 111.8 kg 110.6 kg 93.7 kg   General: Obese, sedated, appears comfortable HEENT: Endotracheal tube in place, moist oral mucosa Neuro: RASS -5, paralyzed CV: S1-S2 appreciated PULM: Bilateral breath sounds appreciated, no rales GI: Bowel sounds appreciated Extremities:  1+ peripheral edema Skin: no rashes or lesions  Chest x-ray 4/12-bilateral infiltrate unchanged to slight worsening-reviewed by myself   Labs ABG 7.38/68/66  Chem-7-elevated BUN-stable from previous LFTs within normal limits CBC within normal limits  Resolved Hospital Problem list     Assessment & Plan:   ARDS secondary to Covid-19 PNA P: Continue mechanical ventilation per ARDS protocol Target TVol 6-8cc/kgIBW Target Plateau Pressure < 30cm H20 Target driving pressure less than 15 cm of water Target PaO2 55-65: titrate PEEP/FiO2 per protocol -Patient was afforded proning holiday secondary to facial swelling however oxygen requirement has worsened-was requiring 65% FiO2 now requiring 90% FiO2 to keep sats about 90  COVID-19 pneumonia -On dexamethasone -remdesivir completed -S/p Actemra -Scheduled neb bronchodilators  MSSE bacteremia 2/2 -Completed Ancef -Significant leukocytosis initially, improved -Respiratory cultures-negative   Acute encephalopathy -Deep sedation with RASS goal of -5 -On fentanyl and Versed gtt. -Will reinitiate paralytic for proning  Acute kidney injury -Current creatinine of 1.01 -Avoid nephrotoxic's -Continue to trend  Hypernatremia -Free water-will increase -Improving  Hyperkalemia -Lokelma was given  -Continue to trend electrolytes  Elevated transaminases -History of liver failure in the past but did return to baseline -No history of EtOH use -Trend LFTs -LFTs back to baseline  VS stable Some clinical worsening with increased FiO2 requirement, this may be secondary to inability to prone him secondary to skin pressure on his face  Goals of care was discussed on 4/8-discussed with wife [name], explained mortality of severe ARDS.  His baseline has been  deteriorating due to COPD and as such he does not seem to be a good ECMO candidate.  He would not want to be "hooked up to machines".  He would prefer quality over quantity of life.   We decided to issue a limited CODE STATUS with no CPR in case of cardiac arrest but continue full medical care.  She would be amenable to transition to full comfort if his condition were considered "hopeless"   We will prone him today 4/12  Continue to wean down FiO2 as tolerated  Best practice:  Diet: Tube feeds Pain/Anxiety/Delirium protocol (if indicated): Fentanyl, versed VAP protocol (if indicated): HOB to 30 degrees, daily SBT DVT prophylaxis: Lovenox GI prophylaxis: Protonix Glucose control: SSI Mobility: bed rest Code Status: full code Family Communication: wife updated 02/14/2020 Disposition: ICU  The patient is critically ill with multiple organ systems failure and requires high complexity decision making for assessment and support, frequent evaluation and titration of therapies, application of advanced monitoring technologies and extensive interpretation of multiple databases. Critical Care Time devoted to patient care services described in this note independent of APP/resident time (if applicable)  Is 40 minutes.   Virl Diamond MD [facility] Personal pager: [phone] If unanswered, please page CCM On-call: #[phone]

## 2020-02-16 NOTE — Progress Notes (Signed)
While patient's head being rotated in the proned position OG tube became dislodged and had to be removed. MD notified.

## 2020-02-17 ENCOUNTER — Inpatient Hospital Stay (HOSPITAL_COMMUNITY): Payer: BC Managed Care – PPO

## 2020-02-17 DIAGNOSIS — J9602 Acute respiratory failure with hypercapnia: Secondary | ICD-10-CM | POA: Diagnosis not present

## 2020-02-17 DIAGNOSIS — A4189 Other specified sepsis: Secondary | ICD-10-CM | POA: Diagnosis not present

## 2020-02-17 DIAGNOSIS — J9601 Acute respiratory failure with hypoxia: Secondary | ICD-10-CM | POA: Diagnosis not present

## 2020-02-17 DIAGNOSIS — R918 Other nonspecific abnormal finding of lung field: Secondary | ICD-10-CM | POA: Diagnosis not present

## 2020-02-17 DIAGNOSIS — U071 COVID-19: Secondary | ICD-10-CM | POA: Diagnosis not present

## 2020-02-17 DIAGNOSIS — R0902 Hypoxemia: Secondary | ICD-10-CM | POA: Diagnosis not present

## 2020-02-17 LAB — POCT I-STAT 7, (LYTES, BLD GAS, ICA,H+H)
Acid-Base Excess: 7 mmol/L — ABNORMAL HIGH (ref 0.0–2.0)
Bicarbonate: 35.7 mmol/L — ABNORMAL HIGH (ref 20.0–28.0)
Calcium, Ion: 1.22 mmol/L (ref 1.15–1.40)
HCT: 38 % — ABNORMAL LOW (ref 39.0–52.0)
Hemoglobin: 12.9 g/dL — ABNORMAL LOW (ref 13.0–17.0)
O2 Saturation: 90 %
Potassium: 4.9 mmol/L (ref 3.5–5.1)
Sodium: 147 mmol/L — ABNORMAL HIGH (ref 135–145)
TCO2: 38 mmol/L — ABNORMAL HIGH (ref 22–32)
pCO2 arterial: 72.8 mmHg (ref 32.0–48.0)
pH, Arterial: 7.298 — ABNORMAL LOW (ref 7.350–7.450)
pO2, Arterial: 68 mmHg — ABNORMAL LOW (ref 83.0–108.0)

## 2020-02-17 LAB — GLUCOSE, CAPILLARY
Glucose-Capillary: 119 mg/dL — ABNORMAL HIGH (ref 70–99)
Glucose-Capillary: 122 mg/dL — ABNORMAL HIGH (ref 70–99)
Glucose-Capillary: 147 mg/dL — ABNORMAL HIGH (ref 70–99)
Glucose-Capillary: 174 mg/dL — ABNORMAL HIGH (ref 70–99)
Glucose-Capillary: 216 mg/dL — ABNORMAL HIGH (ref 70–99)
Glucose-Capillary: 99 mg/dL (ref 70–99)

## 2020-02-17 LAB — BASIC METABOLIC PANEL WITH GFR
Anion gap: 7 (ref 5–15)
BUN: 63 mg/dL — ABNORMAL HIGH (ref 8–23)
CO2: 35 mmol/L — ABNORMAL HIGH (ref 22–32)
Calcium: 8.2 mg/dL — ABNORMAL LOW (ref 8.9–10.3)
Chloride: 107 mmol/L (ref 98–111)
Creatinine, Ser: 0.84 mg/dL (ref 0.61–1.24)
GFR calc Af Amer: >60 mL/min
GFR calc non Af Amer: >60 mL/min
Glucose, Bld: 143 mg/dL — ABNORMAL HIGH (ref 70–99)
Potassium: 5.2 mmol/L — ABNORMAL HIGH (ref 3.5–5.1)
Sodium: 149 mmol/L — ABNORMAL HIGH (ref 135–145)

## 2020-02-17 LAB — CBC WITH DIFFERENTIAL/PLATELET
Abs Immature Granulocytes: 0.07 K/uL (ref 0.00–0.07)
Basophils Absolute: 0 K/uL (ref 0.0–0.1)
Basophils Relative: 0 %
Eosinophils Absolute: 0 K/uL (ref 0.0–0.5)
Eosinophils Relative: 0 %
HCT: 40.8 % (ref 39.0–52.0)
Hemoglobin: 11.9 g/dL — ABNORMAL LOW (ref 13.0–17.0)
Immature Granulocytes: 1 %
Lymphocytes Relative: 8 %
Lymphs Abs: 0.8 K/uL (ref 0.7–4.0)
MCH: 31 pg (ref 26.0–34.0)
MCHC: 29.2 g/dL — ABNORMAL LOW (ref 30.0–36.0)
MCV: 106.3 fL — ABNORMAL HIGH (ref 80.0–100.0)
Monocytes Absolute: 0.6 K/uL (ref 0.1–1.0)
Monocytes Relative: 6 %
Neutro Abs: 9.2 K/uL — ABNORMAL HIGH (ref 1.7–7.7)
Neutrophils Relative %: 85 %
Platelets: 151 K/uL (ref 150–400)
RBC: 3.84 MIL/uL — ABNORMAL LOW (ref 4.22–5.81)
RDW: 15.3 % (ref 11.5–15.5)
WBC: 10.7 K/uL — ABNORMAL HIGH (ref 4.0–10.5)
nRBC: 0 % (ref 0.0–0.2)

## 2020-02-17 LAB — MAGNESIUM: Magnesium: 2.4 mg/dL (ref 1.7–2.4)

## 2020-02-17 MED ORDER — QUETIAPINE FUMARATE 50 MG PO TABS: 50.0000 mg | ORAL_TABLET | Freq: Two times a day (BID) | ORAL | Status: DC

## 2020-02-17 MED ORDER — FENTANYL CITRATE (PF) 2500 MCG/50ML IJ SOLN
0.0000 ug/h | Status: DC
  Administered 2020-02-17 – 2020-02-18 (×3): 400 ug/h via INTRAVENOUS
  Administered 2020-02-19 – 2020-02-22 (×7): 350 ug/h via INTRAVENOUS
  Administered 2020-02-23: 200 ug/h via INTRAVENOUS
  Administered 2020-02-24: 300 ug/h via INTRAVENOUS
  Filled 2020-02-17 (×10): qty 100
  Filled 2020-02-17: qty 50
  Filled 2020-02-17 (×5): qty 100

## 2020-02-17 MED ORDER — VITAL 1.5 CAL PO LIQD
1000.0000 mL | ORAL | Status: DC
  Administered 2020-02-17 – 2020-02-24 (×7): 1000 mL
  Filled 2020-02-17 (×10): qty 1000

## 2020-02-17 MED ORDER — FUROSEMIDE 10 MG/ML IJ SOLN
60.0000 mg | Freq: Two times a day (BID) | INTRAMUSCULAR | Status: DC
  Administered 2020-02-17: 60 mg via INTRAVENOUS
  Filled 2020-02-17: qty 6

## 2020-02-17 MED ORDER — FUROSEMIDE 10 MG/ML IJ SOLN
40.0000 mg | Freq: Once | INTRAMUSCULAR | Status: AC
  Administered 2020-02-17: 40 mg via INTRAVENOUS
  Filled 2020-02-17: qty 4

## 2020-02-17 MED ORDER — QUETIAPINE FUMARATE 50 MG PO TABS
50.0000 mg | ORAL_TABLET | Freq: Two times a day (BID) | ORAL | Status: DC
  Administered 2020-02-17 – 2020-02-24 (×15): 50 mg
  Filled 2020-02-17 (×15): qty 1

## 2020-02-17 MED ORDER — DOCUSATE SODIUM 50 MG/5ML PO LIQD
100.0000 mg | Freq: Two times a day (BID) | ORAL | Status: DC
  Administered 2020-02-17 – 2020-02-23 (×12): 100 mg
  Filled 2020-02-17 (×13): qty 10

## 2020-02-17 MED ORDER — POLYETHYLENE GLYCOL 3350 17 G PO PACK
17.0000 g | PACK | Freq: Every day | ORAL | Status: DC
  Administered 2020-02-18 – 2020-02-22 (×5): 17 g
  Filled 2020-02-17 (×5): qty 1

## 2020-02-17 MED ORDER — ADULT MULTIVITAMIN W/MINERALS CH
1.0000 | ORAL_TABLET | Freq: Every day | ORAL | Status: DC
  Administered 2020-02-17 – 2020-02-23 (×7): 1
  Filled 2020-02-17 (×7): qty 1

## 2020-02-17 NOTE — Progress Notes (Signed)
NAME:  Gregory Harrington, MRN:  [id number], DOB:  August 29, 1956, LOS: 9 ADMISSION DATE:  02/20/2020, CONSULTATION DATE:  02/17/20 REFERRING MD: Lynelle Doctor , CHIEF COMPLAINT:  Hypoxia   Brief History   64 y.o. M with PMH of COPD who was diagnosed with Covid-19 on 3/26, presented with worsening hypoxia and multi-focal PNA requiring intubation.   Past Medical History   has a past medical history of COPD (chronic obstructive pulmonary disease) (HCC). Tobacco use (quit 2007)  Significant Hospital Events   4/4 Admit to PCCM and initiate proning  4/5 paralyse & prone 4/7-remains prone, hypoxemic 4/9-proning 4/10-still requiring proning 4/11-proning holiday yesterday  Consults:    Procedures:  4/4 ETT >> 4/5 RT fem CVL >>  4/6 echo >>nml LV function  Micro Data:  4/4 POC Covid-19>>negative 4/4 BCx2>> MSSE 2/2 4/5 resp >> rare gram positive cocci, rare gram-positive rods-normal respiratory flora  Antimicrobials:  Azithromycin 4/4 only Ceftriaxone 4/4 -4/5 Vanc 4/5  Ancef   Interim history/subjective:   Unable to maintain cough seal this morning.  Possible ET tube dislodgment.  Reintubated by DL without incident.  Oxygenation recovered promptly.  Objective   Blood pressure 111/75, pulse (!) 53, temperature 98.1 F (36.7 C), temperature source Oral, resp. rate (!) 26, height 5\' 11"  (1.803 m), weight 93.8 kg, SpO2 94 %.    Vent Mode: PRVC FiO2 (%):  [80 %-90 %] 80 % Set Rate:  [32 bmp] 32 bmp Vt Set:  [450 mL] 450 mL PEEP:  [20 cmH20] 20 cmH20 Plateau Pressure:  [29 cmH20-32 cmH20] 29 cmH20   Intake/Output Summary (Last 24 hours) at 02/17/2020 02/19/2020 Last data filed at 02/17/2020 0800 Gross per 24 hour  Intake 1650.88 ml  Output 1600 ml  Net 50.88 ml   Filed Weights   02/15/20 0500 02/16/20 0500 02/17/20 0500  Weight: 110.6 kg 93.7 kg 93.8 kg   General: Obese, sedated, appears comfortable HEENT: Endotracheal tube in place, moist oral mucosa Neuro: RASS -5, paralyzed CV: S1-S2  appreciated PULM: Bilateral breath sounds appreciated, no rales GI: Bowel sounds appreciated Extremities: 1+ peripheral edema Skin: no rashes or lesions  Resolved Hospital Problem list     Assessment & Plan:   Critically ill due to ARDS secondary to Covid-19 PNA require mechanical ventilation. Oxygenation appears adequate -We will leave supine today and recheck blood gas post reintubation. -If oxygenation remains adequate we will discontinue paralytic and begin sedation wean. -Attempt diuresis today.  COVID-19 pneumonia -On dexamethasone -remdesivir completed -S/p Actemra -Scheduled neb bronchodilators  MSSE bacteremia 2/2 -Completed Ancef -Significant leukocytosis initially, improved -Respiratory cultures-negative   Acute encephalopathy -Deep sedation with RASS goal of -5 -On fentanyl and Versed gtt. -Wean propofol off  Hypernatremia -Free water-will increase -Improving  Hyperkalemia -Lokelma was given  -Continue to trend electrolytes  Elevated transaminases -History of liver failure in the past but did return to baseline -No history of EtOH use -Trend LFTs -LFTs back to baseline   Goals of care was discussed on 4/8-discussed with wife 04-11-1990, explained mortality of severe ARDS.  His baseline has been deteriorating due to COPD and as such he does not seem to be a good ECMO candidate.  He would not want to be "hooked up to machines".  He would prefer quality over quantity of life.  We decided to issue a limited CODE STATUS with no CPR in case of cardiac arrest but continue full medical care.  She would be amenable to transition to full comfort if his condition were considered "  hopeless"   Best practice:  Diet: Tube feeds Pain/Anxiety/Delirium protocol (if indicated): Fentanyl, versed VAP protocol (if indicated): Bundle in place DVT prophylaxis: Lovenox GI prophylaxis: Protonix Glucose control: SSI Mobility: bed rest Code Status: full code Family  Communication: wife updated 02/14/2020 Disposition: ICU  CRITICAL CARE Performed by: Kipp Brood   Total critical care time: 35 minutes  Critical care time was exclusive of separately billable procedures and treating other patients.  Critical care was necessary to treat or prevent imminent or life-threatening deterioration.  Critical care was time spent personally by me on the following activities: development of treatment plan with patient and/or surrogate as well as nursing, discussions with consultants, evaluation of patient's response to treatment, examination of patient, obtaining history from patient or surrogate, ordering and performing treatments and interventions, ordering and review of laboratory studies, ordering and review of radiographic studies, pulse oximetry, re-evaluation of patient's condition and participation in multidisciplinary rounds.  Kipp Brood, MD [facility] ICU Physician [place]  Pager: [phone] Mobile: [phone] After hours: [phone].

## 2020-02-17 NOTE — Progress Notes (Signed)
eLink Physician-Brief Progress Note Patient Name: Myquan Schaumburg DOB: 1956-02-13 MRN: [id number]   Date of Service  02/17/2020  HPI/Events of Note  Request for AM lab orders.   eICU Interventions  Will order: 1. CBC with platelets, BMP and Mg++ level in AM.      Intervention Category Major Interventions: Other:  Lenell Antu 02/17/2020, 3:42 AM

## 2020-02-17 NOTE — Progress Notes (Signed)
Assisted tele visit to patient with wife.  Essynce Munsch R, RN  

## 2020-02-17 NOTE — Significant Event (Signed)
Persistently elevated CO2 with worsening pH. Will resume prone ventilation.  Lynnell Catalan, MD [facility] ICU Physician [facility] [place] [facility]  Pager: [phone] Mobile: [phone] After hours: [phone].  02/17/2020, 2:46 PM

## 2020-02-17 NOTE — Procedures (Signed)
Intubation Procedure Note  Gregory Harrington  [id number] 05-24-1956   Procedure: Intubation Indications: Respiratory insufficiency  Procedure Details Consent: Unable to obtain consent because of emergent medical necessity. Time Out: Verified patient identification, verified procedure, site/side was marked, verified correct patient position, special equipment/implants available, medications/allergies/relevent history reviewed, required imaging and test results available.  Performed  Pre-oxygenation: 100% via ventilator and BVM Premedication: Already sedated and paralyzed Position: supine Technique: Direct laryngoscopy MAC 4 blade Tube size: 7.5 Laryngoscopy view: Grade 1 Number of attempts: 1 Insertion depth: 23 cm Tube secured: tube holder   Evaluation Colorimetric change: Positive Bilateral breath sounds: Present Hemodynamic Status: BP stable throughout; O2 sats: transiently fell during during procedure Patient's Current Condition: stable Complications: No apparent complications Patient did tolerate procedure well. Chest X-ray ordered to verify placement.  CXR: tube position acceptable.   Gregory Harrington 09/18/2018

## 2020-02-17 NOTE — Progress Notes (Signed)
Nutrition Follow-up  DOCUMENTATION CODES:   Obesity unspecified  INTERVENTION:   Tube Feeding:  Change to Vital 1.5 at 40 ml/hr Pro-Stat 60 mL TID Provides 2040 kcals, 155 g of protein and 730 mL of free water Meets 100% estimated protein needs  TF regimen and propofol at current rate providing 2217 total kcal/day   Add MVI with Minerals   NUTRITION DIAGNOSIS:   Inadequate oral intake related to inability to eat as evidenced by NPO status.  Being addressed via TF   GOAL:   Provide needs based on ASPEN/SCCM guidelines  Progressing   MONITOR:   Vent status, Labs, Weight trends, TF tolerance, Skin, I & O's  REASON FOR ASSESSMENT:   Consult, Ventilator Assessment of nutrition requirement/status, Enteral/tube feeding initiation and management  ASSESSMENT:   63yM with history of COPD on advair presenting with increasing dyspnea noted to have cyanosis and hypoxia at home on pulse ox and found to have severe ARDS from covid-19 pneumonia.   4/04 Admitted, Intubated, Prone positioning initiated  Pt remains on vent support, supine today, possible d/c of paralytic today  Vital High Protein at 35 ml/hr, Pro-stat 60 mL TID, free water 200 mL q 4 hours  Propofol: 6.7 ml/hr  No documented BM since admission; noted miralax ordered to start tomorrow, colace BID today  Weight down to 93.8 kg; admit weight 107 kg. Net +10 L per I/O flow sheet  Labs: sodium 147 (L), potassium wdl, Creatinine wdl Meds: reviewed; decadron, colace miralax    Diet Order:   Diet Order            Diet NPO time specified  Diet effective now              EDUCATION NEEDS:   Not appropriate for education at this time  Skin:  Skin Assessment: Skin Integrity Issues: Skin Integrity Issues:: DTI DTI: mouth x 2-upper and lower lip  Last BM:  constipated; documented BM  Height:   Ht Readings from Last 1 Encounters:  02/10/20 5\' 11"  (1.803 m)    Weight:   Wt Readings from Last 1  Encounters:  02/17/20 93.8 kg    Ideal Body Weight:  78.2 kg  BMI:  Body mass index is 28.84 kg/m.  Estimated Nutritional Needs:   Kcal:  2300-2700 kcals  Protein:  140-170 g  Fluid:  >/= 2 L   02/19/20 MS, RDN, LDN, CNSC RD Pager Number and Weekend/On-Call After Hours Pager Located in [place]

## 2020-02-17 NOTE — Progress Notes (Signed)
Pt proned without complication.  ETT secured with cloth tape. Arms re-positioned.

## 2020-02-17 NOTE — Progress Notes (Signed)
Pt unproned at 0615 without incident or change in vital signs.

## 2020-02-18 DIAGNOSIS — A4189 Other specified sepsis: Secondary | ICD-10-CM | POA: Diagnosis not present

## 2020-02-18 DIAGNOSIS — J9602 Acute respiratory failure with hypercapnia: Secondary | ICD-10-CM | POA: Diagnosis not present

## 2020-02-18 DIAGNOSIS — J9601 Acute respiratory failure with hypoxia: Secondary | ICD-10-CM | POA: Diagnosis not present

## 2020-02-18 DIAGNOSIS — U071 COVID-19: Secondary | ICD-10-CM | POA: Diagnosis not present

## 2020-02-18 LAB — POCT I-STAT 7, (LYTES, BLD GAS, ICA,H+H)
Acid-Base Excess: 10 mmol/L — ABNORMAL HIGH (ref 0.0–2.0)
Acid-Base Excess: 10 mmol/L — ABNORMAL HIGH (ref 0.0–2.0)
Bicarbonate: 39.5 mmol/L — ABNORMAL HIGH (ref 20.0–28.0)
Bicarbonate: 39.8 mmol/L — ABNORMAL HIGH (ref 20.0–28.0)
Calcium, Ion: 1.24 mmol/L (ref 1.15–1.40)
Calcium, Ion: 1.25 mmol/L (ref 1.15–1.40)
HCT: 36 % — ABNORMAL LOW (ref 39.0–52.0)
HCT: 35 % — ABNORMAL LOW (ref 39.0–52.0)
Hemoglobin: 12.2 g/dL — ABNORMAL LOW (ref 13.0–17.0)
Hemoglobin: 11.9 g/dL — ABNORMAL LOW (ref 13.0–17.0)
O2 Saturation: 87 %
O2 Saturation: 88 %
Potassium: 5 mmol/L (ref 3.5–5.1)
Potassium: 4.3 mmol/L (ref 3.5–5.1)
Sodium: 148 mmol/L — ABNORMAL HIGH (ref 135–145)
Sodium: 147 mmol/L — ABNORMAL HIGH (ref 135–145)
TCO2: 42 mmol/L — ABNORMAL HIGH (ref 22–32)
TCO2: 42 mmol/L — ABNORMAL HIGH (ref 22–32)
pCO2 arterial: 80.9 mmHg (ref 32.0–48.0)
pCO2 arterial: 87.6 mmHg (ref 32.0–48.0)
pH, Arterial: 7.297 — ABNORMAL LOW (ref 7.350–7.450)
pH, Arterial: 7.266 — ABNORMAL LOW (ref 7.350–7.450)
pO2, Arterial: 62 mmHg — ABNORMAL LOW (ref 83.0–108.0)
pO2, Arterial: 66 mmHg — ABNORMAL LOW (ref 83.0–108.0)

## 2020-02-18 LAB — CBC
HCT: 40.8 % (ref 39.0–52.0)
Hemoglobin: 12 g/dL — ABNORMAL LOW (ref 13.0–17.0)
MCH: 31.9 pg (ref 26.0–34.0)
MCHC: 29.4 g/dL — ABNORMAL LOW (ref 30.0–36.0)
MCV: 108.5 fL — ABNORMAL HIGH (ref 80.0–100.0)
Platelets: 147 10*3/uL — ABNORMAL LOW (ref 150–400)
RBC: 3.76 MIL/uL — ABNORMAL LOW (ref 4.22–5.81)
RDW: 15.5 % (ref 11.5–15.5)
WBC: 11.6 10*3/uL — ABNORMAL HIGH (ref 4.0–10.5)
nRBC: 0 % (ref 0.0–0.2)

## 2020-02-18 LAB — GLUCOSE, CAPILLARY
Glucose-Capillary: 149 mg/dL — ABNORMAL HIGH (ref 70–99)
Glucose-Capillary: 160 mg/dL — ABNORMAL HIGH (ref 70–99)
Glucose-Capillary: 165 mg/dL — ABNORMAL HIGH (ref 70–99)
Glucose-Capillary: 186 mg/dL — ABNORMAL HIGH (ref 70–99)
Glucose-Capillary: 206 mg/dL — ABNORMAL HIGH (ref 70–99)
Glucose-Capillary: 206 mg/dL — ABNORMAL HIGH (ref 70–99)

## 2020-02-18 LAB — BASIC METABOLIC PANEL
Anion gap: 9 (ref 5–15)
BUN: 70 mg/dL — ABNORMAL HIGH (ref 8–23)
CO2: 35 mmol/L — ABNORMAL HIGH (ref 22–32)
Calcium: 8.3 mg/dL — ABNORMAL LOW (ref 8.9–10.3)
Chloride: 105 mmol/L (ref 98–111)
Creatinine, Ser: 0.98 mg/dL (ref 0.61–1.24)
GFR calc Af Amer: >60 mL/min (ref >60)
GFR calc non Af Amer: >60 mL/min (ref >60)
Glucose, Bld: 224 mg/dL — ABNORMAL HIGH (ref 70–99)
Potassium: 5.1 mmol/L (ref 3.5–5.1)
Sodium: 149 mmol/L — ABNORMAL HIGH (ref 135–145)

## 2020-02-18 MED ORDER — FUROSEMIDE 10 MG/ML IJ SOLN
80.0000 mg | Freq: Two times a day (BID) | INTRAMUSCULAR | Status: DC
  Administered 2020-02-18 – 2020-02-20 (×5): 80 mg via INTRAVENOUS
  Filled 2020-02-18 (×5): qty 8

## 2020-02-18 MED ORDER — NOREPINEPHRINE 4 MG/250ML-% IV SOLN
0.0000 ug/min | INTRAVENOUS | Status: DC
  Administered 2020-02-18: 4 ug/min via INTRAVENOUS

## 2020-02-18 MED ORDER — NOREPINEPHRINE 4 MG/250ML-% IV SOLN
INTRAVENOUS | Status: AC
  Filled 2020-02-18: qty 250

## 2020-02-18 NOTE — Progress Notes (Signed)
NAME:  Gregory Harrington, MRN:  [id number], DOB:  1956-03-23, LOS: 10 ADMISSION DATE:  03/07/20, CONSULTATION DATE:  02/18/20 REFERRING MD: Lynelle Doctor , CHIEF COMPLAINT:  Hypoxia   Brief History   64 y.o. M with PMH of COPD who was diagnosed with Covid-19 on 3/26, presented with worsening hypoxia and multi-focal PNA requiring intubation.   Past Medical History   has a past medical history of COPD (chronic obstructive pulmonary disease) (HCC). Tobacco use (quit 2007)  Significant Hospital Events   4/4 Admit to PCCM and initiate proning  4/5 paralyse & prone 4/7-remains prone, hypoxemic 4/9-proning 4/10-still requiring proning 4/11-proning holiday yesterday  Consults:    Procedures:  4/4 ETT >> 4/5 RT fem CVL >>  4/6 echo >>nml LV function  Micro Data:  4/4 POC Covid-19>>negative 4/4 BCx2>> MSSE 2/2 4/5 resp >> rare gram positive cocci, rare gram-positive rods-normal respiratory flora  Antimicrobials:  Azithromycin 4/4 only Ceftriaxone 4/4 -4/5 Vanc 4/5  Ancef   Interim history/subjective:   Prone ventilation for persistent hypercarbia yesterday with no improvement in CO2 by this morning.   Objective   Blood pressure 109/74, pulse 69, temperature 98.3 F (36.8 C), temperature source Oral, resp. rate (!) 32, height 5\' 11"  (1.803 m), weight 93.8 kg, SpO2 94 %.    Vent Mode: PRVC FiO2 (%):  [80 %-100 %] 80 % Set Rate:  [32 bmp] 32 bmp Vt Set:  [450 mL] 450 mL PEEP:  [20 cmH20] 20 cmH20 Plateau Pressure:  [27 cmH20-32 cmH20] 27 cmH20   Intake/Output Summary (Last 24 hours) at 02/18/2020 0940 Last data filed at 02/18/2020 0800 Gross per 24 hour  Intake 3280.06 ml  Output 3475 ml  Net -194.94 ml   Filed Weights   02/15/20 0500 02/16/20 0500 02/17/20 0500  Weight: 110.6 kg 93.7 kg 93.8 kg   General: Obese, sedated, appears comfortable HEENT: Endotracheal tube in place, moist oral mucosa Neuro: RASS -5, paralyzed CV: S1-S2 appreciated PULM: Bilateral breath sounds  appreciated, no rales GI: Bowel sounds appreciated Extremities: 1+ peripheral edema Skin: no rashes or lesions  Resolved Hospital Problem list     Assessment & Plan:   Critically ill due to ARDS secondary to Covid-19 PNA require mechanical ventilation. Oxygenation appears adequate, overdistention and gas trapping may be contributing to hypercarbia. - Wean FiO2 and PEEP per ARDSnet protocol.  - Decrease respiratory rate to reduce auto-PEEP. - Recheck ABG and prone if CO2 continues to worsen.  02/19/20 COVID-19 pneumonia -On dexamethasone -remdesivir completed -S/p Actemra -Scheduled neb bronchodilators  MSSE bacteremia 2/2 -Completed Ancef -Significant leukocytosis initially, improved -Respiratory cultures-negative   Acute encephalopathy -Deep sedation with RASS goal of -5 -On fentanyl and Versed gtt. -Wean propofol off   Goals of care was discussed on 4/8-discussed with wife 04-11-1990, explained mortality of severe ARDS.  His baseline has been deteriorating due to COPD and as such he does not seem to be a good ECMO candidate.  He would not want to be "hooked up to machines".  He would prefer quality over quantity of life.  We decided to issue a limited CODE STATUS with no CPR in case of cardiac arrest but continue full medical care.  She would be amenable to transition to full comfort if his condition were considered "hopeless"   Best practice:  Diet: Tube feeds Pain/Anxiety/Delirium protocol (if indicated): Fentanyl, versed VAP protocol (if indicated): Bundle in place DVT prophylaxis: Lovenox GI prophylaxis: Protonix Glucose control: SSI Mobility: bed rest Code Status: full code Family Communication:  wife updated 02/14/2020 Disposition: ICU  CRITICAL CARE Performed by: Kipp Brood   Total critical care time: 40 minutes  Critical care time was exclusive of separately billable procedures and treating other patients.  Critical care was necessary to treat or prevent  imminent or life-threatening deterioration.  Critical care was time spent personally by me on the following activities: development of treatment plan with patient and/or surrogate as well as nursing, discussions with consultants, evaluation of patient's response to treatment, examination of patient, obtaining history from patient or surrogate, ordering and performing treatments and interventions, ordering and review of laboratory studies, ordering and review of radiographic studies, pulse oximetry, re-evaluation of patient's condition and participation in multidisciplinary rounds.  Kipp Brood, MD [facility] ICU Physician [place]  Pager: [phone] Mobile: [phone] After hours: [phone].

## 2020-02-18 NOTE — Progress Notes (Signed)
Patient unproned without any complications or changed in vital signs.

## 2020-02-18 NOTE — Progress Notes (Signed)
Pt supined without any complications.   

## 2020-02-18 NOTE — Progress Notes (Signed)
Assisted tele visit to patient with wife.  Serjio Deupree Anderson, RN   

## 2020-02-18 NOTE — Progress Notes (Signed)
Pt proned. Arms re-positioned.  ETT secure.

## 2020-02-19 DIAGNOSIS — U071 COVID-19: Secondary | ICD-10-CM | POA: Diagnosis not present

## 2020-02-19 DIAGNOSIS — A4189 Other specified sepsis: Secondary | ICD-10-CM | POA: Diagnosis not present

## 2020-02-19 DIAGNOSIS — J9601 Acute respiratory failure with hypoxia: Secondary | ICD-10-CM | POA: Diagnosis not present

## 2020-02-19 DIAGNOSIS — J9602 Acute respiratory failure with hypercapnia: Secondary | ICD-10-CM | POA: Diagnosis not present

## 2020-02-19 LAB — POCT I-STAT 7, (LYTES, BLD GAS, ICA,H+H)
Acid-Base Excess: 12 mmol/L — ABNORMAL HIGH (ref 0.0–2.0)
Acid-Base Excess: 13 mmol/L — ABNORMAL HIGH (ref 0.0–2.0)
Acid-Base Excess: 13 mmol/L — ABNORMAL HIGH (ref 0.0–2.0)
Bicarbonate: 40.3 mmol/L — ABNORMAL HIGH (ref 20.0–28.0)
Bicarbonate: 41.2 mmol/L — ABNORMAL HIGH (ref 20.0–28.0)
Bicarbonate: 43.4 mmol/L — ABNORMAL HIGH (ref 20.0–28.0)
Calcium, Ion: 1.09 mmol/L — ABNORMAL LOW (ref 1.15–1.40)
Calcium, Ion: 1.26 mmol/L (ref 1.15–1.40)
Calcium, Ion: 1.24 mmol/L (ref 1.15–1.40)
HCT: 37 % — ABNORMAL LOW (ref 39.0–52.0)
HCT: 34 % — ABNORMAL LOW (ref 39.0–52.0)
HCT: 36 % — ABNORMAL LOW (ref 39.0–52.0)
Hemoglobin: 12.6 g/dL — ABNORMAL LOW (ref 13.0–17.0)
Hemoglobin: 11.6 g/dL — ABNORMAL LOW (ref 13.0–17.0)
Hemoglobin: 12.2 g/dL — ABNORMAL LOW (ref 13.0–17.0)
O2 Saturation: 91 %
O2 Saturation: 94 %
O2 Saturation: 91 %
Patient temperature: 98.6
Patient temperature: 98.2
Patient temperature: 98.2
Potassium: 7.5 mmol/L (ref 3.5–5.1)
Potassium: 4 mmol/L (ref 3.5–5.1)
Potassium: 4.2 mmol/L (ref 3.5–5.1)
Sodium: 145 mmol/L (ref 135–145)
Sodium: 148 mmol/L — ABNORMAL HIGH (ref 135–145)
Sodium: 148 mmol/L — ABNORMAL HIGH (ref 135–145)
TCO2: 42 mmol/L — ABNORMAL HIGH (ref 22–32)
TCO2: 43 mmol/L — ABNORMAL HIGH (ref 22–32)
TCO2: 46 mmol/L — ABNORMAL HIGH (ref 22–32)
pCO2 arterial: 67.9 mmHg (ref 32.0–48.0)
pCO2 arterial: 72.8 mmHg (ref 32.0–48.0)
pCO2 arterial: 88.2 mmHg (ref 32.0–48.0)
pH, Arterial: 7.381 (ref 7.350–7.450)
pH, Arterial: 7.36 (ref 7.350–7.450)
pH, Arterial: 7.3 — ABNORMAL LOW (ref 7.350–7.450)
pO2, Arterial: 66 mmHg — ABNORMAL LOW (ref 83.0–108.0)
pO2, Arterial: 79 mmHg — ABNORMAL LOW (ref 83.0–108.0)
pO2, Arterial: 71 mmHg — ABNORMAL LOW (ref 83.0–108.0)

## 2020-02-19 LAB — CBC WITH DIFFERENTIAL/PLATELET
Abs Immature Granulocytes: 0.18 10*3/uL — ABNORMAL HIGH (ref 0.00–0.07)
Basophils Absolute: 0.1 10*3/uL (ref 0.0–0.1)
Basophils Relative: 1 %
Eosinophils Absolute: 0.3 10*3/uL (ref 0.0–0.5)
Eosinophils Relative: 3 %
HCT: 38.8 % — ABNORMAL LOW (ref 39.0–52.0)
Hemoglobin: 11.4 g/dL — ABNORMAL LOW (ref 13.0–17.0)
Immature Granulocytes: 2 %
Lymphocytes Relative: 13 %
Lymphs Abs: 1.3 10*3/uL (ref 0.7–4.0)
MCH: 31.8 pg (ref 26.0–34.0)
MCHC: 29.4 g/dL — ABNORMAL LOW (ref 30.0–36.0)
MCV: 108.1 fL — ABNORMAL HIGH (ref 80.0–100.0)
Monocytes Absolute: 1 10*3/uL (ref 0.1–1.0)
Monocytes Relative: 10 %
Neutro Abs: 7 10*3/uL (ref 1.7–7.7)
Neutrophils Relative %: 71 %
Platelets: 147 10*3/uL — ABNORMAL LOW (ref 150–400)
RBC: 3.59 MIL/uL — ABNORMAL LOW (ref 4.22–5.81)
RDW: 16.4 % — ABNORMAL HIGH (ref 11.5–15.5)
WBC: 9.9 10*3/uL (ref 4.0–10.5)
nRBC: 0 % (ref 0.0–0.2)

## 2020-02-19 LAB — BASIC METABOLIC PANEL
Anion gap: 6 (ref 5–15)
BUN: 63 mg/dL — ABNORMAL HIGH (ref 8–23)
CO2: 41 mmol/L — ABNORMAL HIGH (ref 22–32)
Calcium: 8.6 mg/dL — ABNORMAL LOW (ref 8.9–10.3)
Chloride: 104 mmol/L (ref 98–111)
Creatinine, Ser: 0.87 mg/dL (ref 0.61–1.24)
GFR calc Af Amer: >60 mL/min (ref >60)
GFR calc non Af Amer: >60 mL/min (ref >60)
Glucose, Bld: 149 mg/dL — ABNORMAL HIGH (ref 70–99)
Potassium: 4.3 mmol/L (ref 3.5–5.1)
Sodium: 151 mmol/L — ABNORMAL HIGH (ref 135–145)

## 2020-02-19 LAB — GLUCOSE, CAPILLARY
Glucose-Capillary: 118 mg/dL — ABNORMAL HIGH (ref 70–99)
Glucose-Capillary: 142 mg/dL — ABNORMAL HIGH (ref 70–99)
Glucose-Capillary: 151 mg/dL — ABNORMAL HIGH (ref 70–99)
Glucose-Capillary: 154 mg/dL — ABNORMAL HIGH (ref 70–99)
Glucose-Capillary: 174 mg/dL — ABNORMAL HIGH (ref 70–99)
Glucose-Capillary: 183 mg/dL — ABNORMAL HIGH (ref 70–99)

## 2020-02-19 LAB — TRIGLYCERIDES: Triglycerides: 385 mg/dL — ABNORMAL HIGH (ref <150)

## 2020-02-19 MED ORDER — SORBITOL 70 % SOLN
960.0000 mL | TOPICAL_OIL | Freq: Once | ORAL | Status: AC
  Administered 2020-02-19: 960 mL via RECTAL
  Filled 2020-02-19: qty 473

## 2020-02-19 MED ORDER — FREE WATER
300.0000 mL | Status: DC
  Administered 2020-02-19 – 2020-02-22 (×18): 300 mL

## 2020-02-19 NOTE — Progress Notes (Signed)
Per Dr. Denese Killings okay to supine pt at this time. Will assess for re-proning at a later time.

## 2020-02-19 NOTE — Progress Notes (Signed)
Pts head and arms repostioned at this time. ETT remains secure at 25cm at the lip. Pt tolerated well. RT will continue to monitor.

## 2020-02-19 NOTE — Progress Notes (Signed)
eLink Physician-Brief Progress Note Patient Name: Rusty Villella DOB: 1956-03-14 MRN: [id number]   Date of Service  02/19/2020  HPI/Events of Note  No AM labs ordered.  eICU Interventions  Will order: 1. CBC with platelets and BMP at 5 AM.      Intervention Category Major Interventions: Other:  Lenell Antu 02/19/2020, 4:42 AM

## 2020-02-19 NOTE — Progress Notes (Signed)
Assisted tele visit to patient with wife.  Netanya Yazdani R, RN  

## 2020-02-19 NOTE — Progress Notes (Signed)
Critical ABG results given to Dr. Denese Killings. Verbal order received to increase VT to 500, increase RR to 32 and decrease PEEP to 16. Verbal order also received to obtain ABG in 4hours. RT will continue to monitor. RN notified of changes. Pt to remain prone at this time.

## 2020-02-19 NOTE — Progress Notes (Signed)
Pt due to be supined at 0820, per Dr. Denese Killings pt to be left prone at this time. RN notified of this as well. RT will continue to monitor.

## 2020-02-19 NOTE — Progress Notes (Signed)
BIS currently not functioning consistently, electrodes changed, all equipment disconnected and reconnected but remains inconsistent. VSS, no other changes in patient condition. Will continue to monitor TOF as prescribed. Last BIS at 2200 was 36-42. Will attempt finding new cable to change out.

## 2020-02-19 NOTE — Progress Notes (Signed)
NAME:  Gregory Harrington, MRN:  [id number], DOB:  23-Jul-1956, LOS: 75 ADMISSION DATE:  02/22/2020, CONSULTATION DATE:  02/19/20 REFERRING MD: Tomi Bamberger , CHIEF COMPLAINT:  Hypoxia   Brief History   64 y.o. M with PMH of COPD who was diagnosed with Covid-19 on 3/26, presented with worsening hypoxia and multi-focal PNA requiring intubation.   Past Medical History   has a past medical history of COPD (chronic obstructive pulmonary disease) ([place]). Tobacco use (quit 2007)  Significant Hospital Events   4/4 Admit to PCCM and initiate proning  4/5 paralyse & prone 4/7-remains prone, hypoxemic 4/9-proning 4/10-still requiring proning 4/11-proning holiday yesterday  Consults:    Procedures:  4/4 ETT >> 4/5 RT fem CVL >>  4/6 echo >>nml LV function  Micro Data:  4/4 POC Covid-19>>negative 4/4 BCx2>> MSSE 2/2 4/5 resp >> rare gram positive cocci, rare gram-positive rods-normal respiratory flora  Antimicrobials:  Azithromycin 4/4 only Ceftriaxone 4/4 -4/5 Vanc 4/5  Ancef   Interim history/subjective:   Improvement in CO2 overnight with prone ventilation.  Objective   Blood pressure (!) 148/55, pulse (!) 58, temperature 98.2 F (36.8 C), temperature source Oral, resp. rate (!) 30, height 5\' 11"  (1.803 m), weight 93.8 kg, SpO2 90 %.    Vent Mode: PRVC FiO2 (%):  [80 %-100 %] 90 % Set Rate:  [30 bmp-32 bmp] 30 bmp Vt Set:  [450 mL] 450 mL PEEP:  [18 cmH20-20 cmH20] 18 cmH20 Plateau Pressure:  [25 cmH20-32 cmH20] 27 cmH20   Intake/Output Summary (Last 24 hours) at 02/19/2020 0921 Last data filed at 02/19/2020 0800 Gross per 24 hour  Intake 3967.51 ml  Output 3275 ml  Net 692.51 ml   Filed Weights   02/15/20 0500 02/16/20 0500 02/17/20 0500  Weight: 110.6 kg 93.7 kg 93.8 kg   General: Obese, sedated, paralyzed. HEENT: Endotracheal tube in place, moist oral mucosa Neuro: RASS -5, paralyzed CV: S1-S2 appreciated PULM: Bilateral breath sounds appreciated, no rales GI: Bowel sounds  appreciated Extremities: 1+ peripheral edema Skin: no rashes or lesions  Resolved Hospital Problem list     Assessment & Plan:   Critically ill due to ARDS secondary to Covid-19 PNA require mechanical ventilation. Oxygenation appears adequate, overdistention and gas trapping may be contributing to hypercarbia. - Wean FiO2 and PEEP per ARDSnet protocol.  - Decrease respiratory rate to reduce auto-PEEP. - Recheck ABG and prone if CO2 continues to worsen.  -Keep prone today. Marland Kitchen COVID-19 pneumonia -On dexamethasone -remdesivir completed -S/p Actemra -Scheduled neb bronchodilators  MSSE bacteremia 2/2 -Completed Ancef -Significant leukocytosis initially, improved -Respiratory cultures-negative   Acute encephalopathy -Deep sedation with RASS goal of -5 -On fentanyl and Versed gtt. -Wean propofol off  Goals of care was discussed on 4/8-discussed with wife [name], explained mortality of severe ARDS.  His baseline has been deteriorating due to COPD and as such he does not seem to be a good ECMO candidate.  He would not want to be "hooked up to machines".  He would prefer quality over quantity of life.  We decided to issue a limited CODE STATUS with no CPR in case of cardiac arrest but continue full medical care.  She would be amenable to transition to full comfort if his condition were considered "hopeless"   Best practice:  Diet: Tube feeds Pain/Anxiety/Delirium protocol (if indicated): Fentanyl, versed VAP protocol (if indicated): Bundle in place DVT prophylaxis: Lovenox GI prophylaxis: Protonix Glucose control: SSI Mobility: bed rest Code Status: full code Family Communication: wife updated 02/18/2020  Disposition: ICU  CRITICAL CARE Performed by: Lynnell Catalan   Total critical care time: 40 minutes  Critical care time was exclusive of separately billable procedures and treating other patients.  Critical care was necessary to treat or prevent imminent or life-threatening  deterioration.  Critical care was time spent personally by me on the following activities: development of treatment plan with patient and/or surrogate as well as nursing, discussions with consultants, evaluation of patient's response to treatment, examination of patient, obtaining history from patient or surrogate, ordering and performing treatments and interventions, ordering and review of laboratory studies, ordering and review of radiographic studies, pulse oximetry, re-evaluation of patient's condition and participation in multidisciplinary rounds.  Lynnell Catalan, MD [facility] ICU Physician [facility] [place] [facility]  Pager: [phone] Mobile: [phone] After hours: [phone].

## 2020-02-19 NOTE — Progress Notes (Signed)
SMOG enema administered to patient.  Patient tolerated well.  Small bowel movement at this time.

## 2020-02-19 NOTE — Progress Notes (Signed)
Pt returned to supine position with RT x1, NT x1 and RN x3. Protective barriers to face removed along with cloth tape. Small abrasion to right cheek noted. New protective barriers placed on pt cheeks and [place] ETT holder placed. ETT resecured at 25cm at the lip to the left side at this time. Pt tolerated well, RT will continue to monitor.

## 2020-02-19 NOTE — Progress Notes (Signed)
eLink Physician-Brief Progress Note Patient Name: Gregory Harrington DOB: 10-15-1956 MRN: [id number]   Date of Service  02/19/2020  HPI/Events of Note  ABG on 100%/PRVC 30/TV 450/P 20 = 7.36/72.8/79/41.2. Patient currently proned.  eICU Interventions  Continue current management.      Intervention Category Major Interventions: Respiratory failure - evaluation and management  Virginia Francisco Eugene 02/19/2020, 4:51 AM

## 2020-02-20 ENCOUNTER — Inpatient Hospital Stay (HOSPITAL_COMMUNITY): Payer: BC Managed Care – PPO

## 2020-02-20 DIAGNOSIS — J9601 Acute respiratory failure with hypoxia: Secondary | ICD-10-CM | POA: Diagnosis not present

## 2020-02-20 DIAGNOSIS — A4189 Other specified sepsis: Secondary | ICD-10-CM | POA: Diagnosis not present

## 2020-02-20 DIAGNOSIS — Z4682 Encounter for fitting and adjustment of non-vascular catheter: Secondary | ICD-10-CM | POA: Diagnosis not present

## 2020-02-20 DIAGNOSIS — U071 COVID-19: Secondary | ICD-10-CM | POA: Diagnosis not present

## 2020-02-20 DIAGNOSIS — J9602 Acute respiratory failure with hypercapnia: Secondary | ICD-10-CM | POA: Diagnosis not present

## 2020-02-20 LAB — POCT I-STAT 7, (LYTES, BLD GAS, ICA,H+H)
Acid-Base Excess: 19 mmol/L — ABNORMAL HIGH (ref 0.0–2.0)
Acid-Base Excess: 19 mmol/L — ABNORMAL HIGH (ref 0.0–2.0)
Bicarbonate: 47.2 mmol/L — ABNORMAL HIGH (ref 20.0–28.0)
Bicarbonate: 46.3 mmol/L — ABNORMAL HIGH (ref 20.0–28.0)
Calcium, Ion: 1.26 mmol/L (ref 1.15–1.40)
Calcium, Ion: 1.17 mmol/L (ref 1.15–1.40)
HCT: 31 % — ABNORMAL LOW (ref 39.0–52.0)
HCT: 33 % — ABNORMAL LOW (ref 39.0–52.0)
Hemoglobin: 10.5 g/dL — ABNORMAL LOW (ref 13.0–17.0)
Hemoglobin: 11.2 g/dL — ABNORMAL LOW (ref 13.0–17.0)
O2 Saturation: 88 %
O2 Saturation: 92 %
Patient temperature: 97.6
Patient temperature: 98.5
Potassium: 3.7 mmol/L (ref 3.5–5.1)
Potassium: 3.5 mmol/L (ref 3.5–5.1)
Sodium: 146 mmol/L — ABNORMAL HIGH (ref 135–145)
Sodium: 148 mmol/L — ABNORMAL HIGH (ref 135–145)
TCO2: 50 mmol/L — ABNORMAL HIGH (ref 22–32)
TCO2: 48 mmol/L — ABNORMAL HIGH (ref 22–32)
pCO2 arterial: 75.2 mmHg (ref 32.0–48.0)
pCO2 arterial: 71 mmHg (ref 32.0–48.0)
pH, Arterial: 7.403 (ref 7.350–7.450)
pH, Arterial: 7.422 (ref 7.350–7.450)
pO2, Arterial: 56 mmHg — ABNORMAL LOW (ref 83.0–108.0)
pO2, Arterial: 67 mmHg — ABNORMAL LOW (ref 83.0–108.0)

## 2020-02-20 LAB — COMPREHENSIVE METABOLIC PANEL
ALT: 78 U/L — ABNORMAL HIGH (ref 0–44)
AST: 80 U/L — ABNORMAL HIGH (ref 15–41)
Albumin: 2.4 g/dL — ABNORMAL LOW (ref 3.5–5.0)
Alkaline Phosphatase: 67 U/L (ref 38–126)
Anion gap: 8 (ref 5–15)
BUN: 52 mg/dL — ABNORMAL HIGH (ref 8–23)
CO2: 42 mmol/L — ABNORMAL HIGH (ref 22–32)
Calcium: 8.7 mg/dL — ABNORMAL LOW (ref 8.9–10.3)
Chloride: 99 mmol/L (ref 98–111)
Creatinine, Ser: 0.77 mg/dL (ref 0.61–1.24)
GFR calc Af Amer: >60 mL/min (ref >60)
GFR calc non Af Amer: >60 mL/min (ref >60)
Glucose, Bld: 164 mg/dL — ABNORMAL HIGH (ref 70–99)
Potassium: 3.8 mmol/L (ref 3.5–5.1)
Sodium: 149 mmol/L — ABNORMAL HIGH (ref 135–145)
Total Bilirubin: 1.2 mg/dL (ref 0.3–1.2)
Total Protein: 4.8 g/dL — ABNORMAL LOW (ref 6.5–8.1)

## 2020-02-20 LAB — GLUCOSE, CAPILLARY
Glucose-Capillary: 170 mg/dL — ABNORMAL HIGH (ref 70–99)
Glucose-Capillary: 142 mg/dL — ABNORMAL HIGH (ref 70–99)
Glucose-Capillary: 132 mg/dL — ABNORMAL HIGH (ref 70–99)
Glucose-Capillary: 158 mg/dL — ABNORMAL HIGH (ref 70–99)
Glucose-Capillary: 165 mg/dL — ABNORMAL HIGH (ref 70–99)

## 2020-02-20 LAB — CBC
HCT: 36 % — ABNORMAL LOW (ref 39.0–52.0)
Hemoglobin: 10.6 g/dL — ABNORMAL LOW (ref 13.0–17.0)
MCH: 31.8 pg (ref 26.0–34.0)
MCHC: 29.4 g/dL — ABNORMAL LOW (ref 30.0–36.0)
MCV: 108.1 fL — ABNORMAL HIGH (ref 80.0–100.0)
Platelets: 129 10*3/uL — ABNORMAL LOW (ref 150–400)
RBC: 3.33 MIL/uL — ABNORMAL LOW (ref 4.22–5.81)
RDW: 16.7 % — ABNORMAL HIGH (ref 11.5–15.5)
WBC: 10.2 10*3/uL (ref 4.0–10.5)
nRBC: 0 % (ref 0.0–0.2)

## 2020-02-20 MED ORDER — FUROSEMIDE 10 MG/ML IJ SOLN
8.0000 mg/h | INTRAVENOUS | Status: DC
  Administered 2020-02-20 – 2020-02-21 (×2): 8 mg/h via INTRAVENOUS
  Filled 2020-02-20 (×3): qty 25

## 2020-02-20 NOTE — Progress Notes (Signed)
Assisted tele visit to patient with wife.  Jakiah Goree Anderson, RN   

## 2020-02-20 NOTE — Progress Notes (Signed)
BIS remains nonfunctional. TOF, VS, unchanged. Respirations synchronous with vent. All cables, electrodes changed out, E-link notified.

## 2020-02-20 NOTE — Progress Notes (Deleted)
Assisted tele visit to patient with family member.  Brnadon Eoff M, RN   

## 2020-02-20 NOTE — Progress Notes (Signed)
NAME:  Gregory Harrington, MRN:  [id number], DOB:  1956/02/02, LOS: 49 ADMISSION DATE:  02/07/2020, CONSULTATION DATE:  02/20/20 REFERRING MD: Tomi Bamberger , CHIEF COMPLAINT:  Hypoxia   Brief History   64 y.o. M with PMH of COPD who was diagnosed with Covid-19 on 3/26, presented with worsening hypoxia and multi-focal PNA requiring intubation.   Past Medical History   has a past medical history of COPD (chronic obstructive pulmonary disease) ([place]). Tobacco use (quit 2007)  Significant Hospital Events   4/4 Admit to PCCM and initiate proning  4/5 paralyse & prone 4/7-remains prone, hypoxemic 4/9-proning 4/10-still requiring proning 4/11-proning holiday yesterday 4/13-15-intermittent proning.  Largely ventilation defect.  Consults:    Procedures:  4/4 ETT >> 4/5 RT fem CVL >>  4/6 echo >>nml LV function  Micro Data:  4/4 POC Covid-19>>negative 4/4 BCx2>> MSSE 2/2 4/5 resp >> rare gram positive cocci, rare gram-positive rods-normal respiratory flora  Antimicrobials:  Azithromycin 4/4 only Ceftriaxone 4/4 -4/5 Vanc 4/5  Ancef   Interim history/subjective:  Hypercarbia has improved with higher tidal volume.  Some worsening in oxygenation.  Objective   Blood pressure 107/68, pulse 79, temperature 98.5 F (36.9 C), temperature source Axillary, resp. rate (!) 34, height 5\' 11"  (1.803 m), weight 111.2 kg, SpO2 91 %.    Vent Mode: PRVC FiO2 (%):  [80 %-100 %] 100 % Set Rate:  [30 bmp-32 bmp] 32 bmp Vt Set:  [450 mL-500 mL] 500 mL PEEP:  [16 cmH20-18 cmH20] 18 cmH20 Plateau Pressure:  [21 cmH20-30 cmH20] 28 cmH20   Intake/Output Summary (Last 24 hours) at 02/20/2020 1303 Last data filed at 02/20/2020 1208 Gross per 24 hour  Intake 2941.9 ml  Output 3255 ml  Net -313.1 ml   Filed Weights   02/16/20 0500 02/17/20 0500 02/20/20 0530  Weight: 93.7 kg 93.8 kg 111.2 kg   General: Obese, sedated, paralyzed. HEENT: Endotracheal tube in place, moist oral mucosa Neuro: RASS -5,  paralyzed CV: S1-S2 appreciated PULM: Bilateral breath sounds appreciated, no rales GI: Bowel sounds appreciated Extremities: 1+ peripheral edema Skin: no rashes or lesions  Resolved Hospital Problem list     Assessment & Plan:   Critically ill due to ARDS secondary to Covid-19 PNA require mechanical ventilation. Oxygenation appears adequate, overdistention and gas trapping may be contributing to hypercarbia. Unable to tolerate APRV. - Wean FiO2 and PEEP per ARDSnet protocol.  - Decrease respiratory rate to reduce auto-PEEP. - Recheck ABG and prone if CO2 continues to worsen.  -Keep supine and stop paralytic. Marland Kitchen COVID-19 pneumonia -On dexamethasone -remdesivir completed -S/p Actemra -Scheduled neb bronchodilators  MSSE bacteremia 2/2 -Completed Ancef -Significant leukocytosis initially, improved -Respiratory cultures-negative   Acute encephalopathy -Deep sedation with RASS goal of -5 -On fentanyl and Versed gtt. -Wean propofol off  Goals of care was discussed on 4/8-discussed with wife [name], explained mortality of severe ARDS.  His baseline has been deteriorating due to COPD and as such he does not seem to be a good ECMO candidate.  He would not want to be "hooked up to machines".  He would prefer quality over quantity of life.  We decided to issue a limited CODE STATUS with no CPR in case of cardiac arrest but continue full medical care.  She would be amenable to transition to full comfort if his condition were considered "hopeless"   Best practice:  Diet: Tube feeds Pain/Anxiety/Delirium protocol (if indicated): Fentanyl, versed VAP protocol (if indicated): Bundle in place DVT prophylaxis: Lovenox GI prophylaxis: Protonix  Glucose control: SSI Mobility: bed rest Code Status: full code Family Communication: wife updated 02/18/2020 Disposition: ICU  CRITICAL CARE Performed by: Lynnell Catalan   Total critical care time: 40 minutes  Critical care time was exclusive  of separately billable procedures and treating other patients.  Critical care was necessary to treat or prevent imminent or life-threatening deterioration.  Critical care was time spent personally by me on the following activities: development of treatment plan with patient and/or surrogate as well as nursing, discussions with consultants, evaluation of patient's response to treatment, examination of patient, obtaining history from patient or surrogate, ordering and performing treatments and interventions, ordering and review of laboratory studies, ordering and review of radiographic studies, pulse oximetry, re-evaluation of patient's condition and participation in multidisciplinary rounds.  Lynnell Catalan, MD [facility] ICU Physician [facility] [place] [facility]  Pager: [phone] Mobile: [phone] After hours: [phone].

## 2020-02-21 ENCOUNTER — Inpatient Hospital Stay (HOSPITAL_COMMUNITY): Payer: BC Managed Care – PPO

## 2020-02-21 DIAGNOSIS — A4189 Other specified sepsis: Secondary | ICD-10-CM | POA: Diagnosis not present

## 2020-02-21 DIAGNOSIS — J9601 Acute respiratory failure with hypoxia: Secondary | ICD-10-CM

## 2020-02-21 DIAGNOSIS — U071 COVID-19: Secondary | ICD-10-CM | POA: Diagnosis not present

## 2020-02-21 DIAGNOSIS — Z4682 Encounter for fitting and adjustment of non-vascular catheter: Secondary | ICD-10-CM | POA: Diagnosis not present

## 2020-02-21 LAB — COOXEMETRY PANEL
Carboxyhemoglobin: 2.3 % — ABNORMAL HIGH (ref 0.5–1.5)
Methemoglobin: 1.2 % (ref 0.0–1.5)
O2 Saturation: 87.6 %
Total hemoglobin: 11.4 g/dL — ABNORMAL LOW (ref 12.0–16.0)

## 2020-02-21 LAB — POCT I-STAT 7, (LYTES, BLD GAS, ICA,H+H)
Acid-Base Excess: 20 mmol/L — ABNORMAL HIGH (ref 0.0–2.0)
Acid-Base Excess: 21 mmol/L — ABNORMAL HIGH (ref 0.0–2.0)
Bicarbonate: 47.8 mmol/L — ABNORMAL HIGH (ref 20.0–28.0)
Bicarbonate: 47.8 mmol/L — ABNORMAL HIGH (ref 20.0–28.0)
Calcium, Ion: 1.15 mmol/L (ref 1.15–1.40)
Calcium, Ion: 1.15 mmol/L (ref 1.15–1.40)
HCT: 33 % — ABNORMAL LOW (ref 39.0–52.0)
HCT: 33 % — ABNORMAL LOW (ref 39.0–52.0)
Hemoglobin: 11.2 g/dL — ABNORMAL LOW (ref 13.0–17.0)
Hemoglobin: 11.2 g/dL — ABNORMAL LOW (ref 13.0–17.0)
O2 Saturation: 89 %
O2 Saturation: 86 %
Patient temperature: 97.9
Patient temperature: 98.2
Potassium: 3.5 mmol/L (ref 3.5–5.1)
Potassium: 3.5 mmol/L (ref 3.5–5.1)
Sodium: 146 mmol/L — ABNORMAL HIGH (ref 135–145)
Sodium: 146 mmol/L — ABNORMAL HIGH (ref 135–145)
TCO2: 50 mmol/L — ABNORMAL HIGH (ref 22–32)
TCO2: 50 mmol/L — ABNORMAL HIGH (ref 22–32)
pCO2 arterial: 67.3 mmHg (ref 32.0–48.0)
pCO2 arterial: 66.5 mmHg (ref 32.0–48.0)
pH, Arterial: 7.458 — ABNORMAL HIGH (ref 7.350–7.450)
pH, Arterial: 7.464 — ABNORMAL HIGH (ref 7.350–7.450)
pO2, Arterial: 55 mmHg — ABNORMAL LOW (ref 83.0–108.0)
pO2, Arterial: 50 mmHg — ABNORMAL LOW (ref 83.0–108.0)

## 2020-02-21 LAB — GLUCOSE, CAPILLARY
Glucose-Capillary: 151 mg/dL — ABNORMAL HIGH (ref 70–99)
Glucose-Capillary: 158 mg/dL — ABNORMAL HIGH (ref 70–99)
Glucose-Capillary: 168 mg/dL — ABNORMAL HIGH (ref 70–99)
Glucose-Capillary: 168 mg/dL — ABNORMAL HIGH (ref 70–99)
Glucose-Capillary: 170 mg/dL — ABNORMAL HIGH (ref 70–99)
Glucose-Capillary: 170 mg/dL — ABNORMAL HIGH (ref 70–99)
Glucose-Capillary: 179 mg/dL — ABNORMAL HIGH (ref 70–99)

## 2020-02-21 LAB — COMPREHENSIVE METABOLIC PANEL
ALT: 117 U/L — ABNORMAL HIGH (ref 0–44)
AST: 118 U/L — ABNORMAL HIGH (ref 15–41)
Albumin: 2.3 g/dL — ABNORMAL LOW (ref 3.5–5.0)
Alkaline Phosphatase: 92 U/L (ref 38–126)
Anion gap: 8 (ref 5–15)
BUN: 46 mg/dL — ABNORMAL HIGH (ref 8–23)
CO2: 44 mmol/L — ABNORMAL HIGH (ref 22–32)
Calcium: 8.4 mg/dL — ABNORMAL LOW (ref 8.9–10.3)
Chloride: 98 mmol/L (ref 98–111)
Creatinine, Ser: 0.84 mg/dL (ref 0.61–1.24)
GFR calc Af Amer: >60 mL/min (ref >60)
GFR calc non Af Amer: >60 mL/min (ref >60)
Glucose, Bld: 164 mg/dL — ABNORMAL HIGH (ref 70–99)
Potassium: 3.4 mmol/L — ABNORMAL LOW (ref 3.5–5.1)
Sodium: 150 mmol/L — ABNORMAL HIGH (ref 135–145)
Total Bilirubin: 1.6 mg/dL — ABNORMAL HIGH (ref 0.3–1.2)
Total Protein: 5.1 g/dL — ABNORMAL LOW (ref 6.5–8.1)

## 2020-02-21 LAB — CBC
HCT: 35.9 % — ABNORMAL LOW (ref 39.0–52.0)
Hemoglobin: 10.8 g/dL — ABNORMAL LOW (ref 13.0–17.0)
MCH: 32.3 pg (ref 26.0–34.0)
MCHC: 30.1 g/dL (ref 30.0–36.0)
MCV: 107.5 fL — ABNORMAL HIGH (ref 80.0–100.0)
Platelets: 133 10*3/uL — ABNORMAL LOW (ref 150–400)
RBC: 3.34 MIL/uL — ABNORMAL LOW (ref 4.22–5.81)
RDW: 17.4 % — ABNORMAL HIGH (ref 11.5–15.5)
WBC: 11.1 10*3/uL — ABNORMAL HIGH (ref 4.0–10.5)
nRBC: 0.2 % (ref 0.0–0.2)

## 2020-02-21 MED ORDER — POTASSIUM CHLORIDE 20 MEQ/15ML (10%) PO SOLN
40.0000 meq | Freq: Once | ORAL | Status: AC
  Administered 2020-02-21: 40 meq via ORAL
  Filled 2020-02-21: qty 30

## 2020-02-21 MED ORDER — METOLAZONE 5 MG PO TABS
5.0000 mg | ORAL_TABLET | Freq: Once | ORAL | Status: AC
  Administered 2020-02-21: 5 mg via ORAL
  Filled 2020-02-21: qty 1

## 2020-02-21 MED ORDER — ENOXAPARIN SODIUM 120 MG/0.8ML ~~LOC~~ SOLN
1.0000 mg/kg | Freq: Two times a day (BID) | SUBCUTANEOUS | Status: DC
  Filled 2020-02-21: qty 0.73

## 2020-02-21 MED ORDER — ENOXAPARIN SODIUM 80 MG/0.8ML ~~LOC~~ SOLN
70.0000 mg | Freq: Once | SUBCUTANEOUS | Status: AC
  Administered 2020-02-21: 70 mg via SUBCUTANEOUS
  Filled 2020-02-21: qty 0.8

## 2020-02-21 MED ORDER — ENOXAPARIN SODIUM 120 MG/0.8ML ~~LOC~~ SOLN
1.0000 mg/kg | Freq: Two times a day (BID) | SUBCUTANEOUS | Status: DC
  Administered 2020-02-22 – 2020-02-24 (×5): 110 mg via SUBCUTANEOUS
  Filled 2020-02-21 (×6): qty 0.73

## 2020-02-21 NOTE — Progress Notes (Cosign Needed Addendum)
PCCM Interval progress note:   Paged with Dr. Mikki Harbor to evaluate patient for possible persistent cuff leak.  Per RT, leak has intermittently been audible with variable tidal volumes, increased with double triggering.  CXR with ETT in similar position as prior, distal to the the vocal chords.  Approximately 10cc air added to cuff over the shift.    ABG with CO2 retention, however improving and pt is not acidotic.   No visible air leak from the pilot balloon.  Cufflator pressures indicate likely slow air leak, however ABG improved and patient is stable.  Sedation up-titrated to avoid vent dyssynchrony, given high oxygen and PEEP requirement, concern that tube exchange could cause rapid respiratory failure.  Continue to monitor for signs of worsening air leak for now.   Gregory Gasman Darshay Deupree, PA-C

## 2020-02-21 NOTE — Progress Notes (Signed)
VASCULAR LAB PRELIMINARY  PRELIMINARY  PRELIMINARY  PRELIMINARY  Bilateral lower extremity venous duplex completed.    Preliminary report:  See CV proc for preliminary results.   Gave report to [place], RN  Rosezetta Schlatter, Gloris Shiroma, RVT 02/21/2020, 6:49 PM

## 2020-02-21 NOTE — Progress Notes (Signed)
NAME:  Gregory Harrington, MRN:  [id number], DOB:  31-May-1956, LOS: 13 ADMISSION DATE:  03/02/2020, CONSULTATION DATE:  02/21/20 REFERRING MD: Lynelle Doctor , CHIEF COMPLAINT:  Hypoxia   Brief History   64 y.o. M with PMH of COPD who was diagnosed with Covid-19 on 3/26, presented with worsening hypoxia and multi-focal PNA requiring intubation.   Past Medical History   has a past medical history of COPD (chronic obstructive pulmonary disease) (HCC). Tobacco use (quit 2007)  Significant Hospital Events   4/4 Admit to PCCM and initiate proning  4/5 paralyse & prone 4/7-remains prone, hypoxemic 4/9-proning 4/10-still requiring proning 4/11-proning holiday yesterday 4/13-15-intermittent proning.  Largely ventilation defect.  Consults:    Procedures:   4/6 echo >>nml LV function  Micro Data:  4/4 POC Covid-19>>negative 4/4 BCx2>> MSSE 2/2 4/5 resp >> rare gram positive cocci, rare gram-positive rods-normal respiratory flora  Antimicrobials:  Azithromycin 4/4 only Ceftriaxone 4/4 -4/5 Vanc 4/5  Ancef 4/7-4/10   Interim history/subjective:  No events, remains on extremely high vent settings and lasix gtt.  Objective   Blood pressure 103/68, pulse 70, temperature 98.2 F (36.8 C), temperature source Axillary, resp. rate 13, height 5\' 11"  (1.803 m), weight 112.4 kg, SpO2 91 %.    Vent Mode: PRVC FiO2 (%):  [80 %-100 %] 100 % Set Rate:  [32 bmp] 32 bmp Vt Set:  [500 mL] 500 mL PEEP:  [18 cmH20] 18 cmH20 Plateau Pressure:  [28 cmH20-30 cmH20] 30 cmH20   Intake/Output Summary (Last 24 hours) at 02/21/2020 0718 Last data filed at 02/21/2020 0600 Gross per 24 hour  Intake 3801.02 ml  Output 3300 ml  Net 501.02 ml   Filed Weights   02/17/20 0500 02/20/20 0530 02/21/20 0429  Weight: 93.8 kg 111.2 kg 112.4 kg   GEN: middle aged heavily sedated on vent HEENT: ETT in place, minimal secretions CV: RRR, ext warm PULM: Clear, no wheezing GI: Soft, +BS EXT: 1+ generalized anasarca NEURO:  withdraws x 4 PSYCH: RASS -4 SKIN: No rashes  Sodium 150 WBC 10>>11 CBGs good +10L admission, weights seems to be up about 3kg L radial a line PICC R arm Foley 7.5ETT   Resolved Hospital Problem list     Assessment & Plan:   Critically ill due to ARDS secondary to Covid-19 PNA require mechanical ventilation. Oxygenation appears adequate, overdistention and gas trapping may be contributing to hypercarbia. Unable to tolerate APRV. - Wean FiO2 and PEEP per ARDSnet protocol.  - No great benefit to proning to date - Will keep working on fluid removal, add dose of metolazone to lasix drip, watch renal function - Will need trach eventually . COVID-19 pneumonia -On dexamethasone -remdesivir completed -S/p Actemra -Scheduled neb bronchodilators  MSSE bacteremia 2/2 -Completed Ancef -Significant leukocytosis initially, improved -Respiratory cultures-negative   Acute encephalopathy -Deep sedation with RASS goal of -5 -Weaning difficult given desaturations with awakening  Goals of care was discussed on 4/8-"discussed with wife 02/23/20, explained mortality of severe ARDS.  His baseline has been deteriorating due to COPD and as such he does not seem to be a good ECMO candidate.  He would not want to be "hooked up to machines".  He would prefer quality over quantity of life.  We decided to issue a limited CODE STATUS with no CPR in case of cardiac arrest but continue full medical care.  She would be amenable to transition to full comfort if his condition were considered 'hopeless' "   Best practice:  Diet: Tube feeds  Pain/Anxiety/Delirium protocol (if indicated): Fentanyl, versed VAP protocol (if indicated): Bundle in place DVT prophylaxis: Lovenox GI prophylaxis: Protonix Glucose control: SSI Mobility: bed rest Code Status: full code Family Communication: wife updated 4/17, understands fair chance he won't live through this Disposition: ICU  The patient is critically ill with  multiple organ systems failure and requires high complexity decision making for assessment and support, frequent evaluation and titration of therapies, application of advanced monitoring technologies and extensive interpretation of multiple databases. Critical Care Time devoted to patient care services described in this note independent of APP/resident time (if applicable)  is 35 minutes.   Erskine Emery MD [facility] 02/21/2020 7:23 AM Personal pager: [phone] If unanswered, please page CCM On-call: [phone]

## 2020-02-21 NOTE — Progress Notes (Signed)
Pt had a cuff leak at the beginning of shift. RT made aware. Pt had a cuff leak again x 2 each hour after. RT paged provider. Critical Care NP to bedside. CXR ordered. MD to bedside. Decision made to increase sedation and continue to monitor. No new orders. RT checked cuff pressure after MD left. It was 40. Cuff pressure decreased to 20 in one hour. Vernona Rieger, NP, made aware. She made MD aware. MD does not want to exchange tube at this time. No new orders. Will continue to monitor.

## 2020-02-21 NOTE — Progress Notes (Signed)
Pt O2sats 84% consistently. MD and RT notified. Rt at bedside. Drawing ABG, co-ox and starting INO.

## 2020-02-21 NOTE — Progress Notes (Addendum)
Worsening oxygenation. Add iNO DVT in leg Up to full dose AC Approaching maximal supportive care. Air leak in ETT has not been issue today.  Myrla Halsted MD PCCM

## 2020-02-21 NOTE — Progress Notes (Signed)
Patient has had an audible cuff leak throughout the shift. Approximately 10cc of air has been added to the cuff throughout the shift. RT has noticed cuff pressure decrease as well. CCM notified and aware of reoccurring cuff leak. Patient does not currently have a cuff leak. RT will continue to monitor.

## 2020-02-22 ENCOUNTER — Inpatient Hospital Stay (HOSPITAL_COMMUNITY): Payer: BC Managed Care – PPO

## 2020-02-22 DIAGNOSIS — J1282 Pneumonia due to coronavirus disease 2019: Secondary | ICD-10-CM | POA: Diagnosis not present

## 2020-02-22 DIAGNOSIS — A4189 Other specified sepsis: Secondary | ICD-10-CM | POA: Diagnosis not present

## 2020-02-22 DIAGNOSIS — U071 COVID-19: Secondary | ICD-10-CM | POA: Diagnosis not present

## 2020-02-22 LAB — POCT I-STAT 7, (LYTES, BLD GAS, ICA,H+H)
Acid-Base Excess: 27 mmol/L — ABNORMAL HIGH (ref 0.0–2.0)
Bicarbonate: 54.1 mmol/L — ABNORMAL HIGH (ref 20.0–28.0)
Calcium, Ion: 1.14 mmol/L — ABNORMAL LOW (ref 1.15–1.40)
HCT: 34 % — ABNORMAL LOW (ref 39.0–52.0)
Hemoglobin: 11.6 g/dL — ABNORMAL LOW (ref 13.0–17.0)
O2 Saturation: 94 %
Patient temperature: 98
Potassium: 2.9 mmol/L — ABNORMAL LOW (ref 3.5–5.1)
Sodium: 143 mmol/L (ref 135–145)
TCO2: >50 mmol/L — ABNORMAL HIGH (ref 22–32)
pCO2 arterial: 68 mmHg (ref 32.0–48.0)
pH, Arterial: 7.507 — ABNORMAL HIGH (ref 7.350–7.450)
pO2, Arterial: 66 mmHg — ABNORMAL LOW (ref 83.0–108.0)

## 2020-02-22 LAB — GLUCOSE, CAPILLARY
Glucose-Capillary: 180 mg/dL — ABNORMAL HIGH (ref 70–99)
Glucose-Capillary: 138 mg/dL — ABNORMAL HIGH (ref 70–99)
Glucose-Capillary: 160 mg/dL — ABNORMAL HIGH (ref 70–99)
Glucose-Capillary: 212 mg/dL — ABNORMAL HIGH (ref 70–99)
Glucose-Capillary: 253 mg/dL — ABNORMAL HIGH (ref 70–99)
Glucose-Capillary: 264 mg/dL — ABNORMAL HIGH (ref 70–99)

## 2020-02-22 LAB — COMPREHENSIVE METABOLIC PANEL
ALT: 142 U/L — ABNORMAL HIGH (ref 0–44)
AST: 97 U/L — ABNORMAL HIGH (ref 15–41)
Albumin: 2.4 g/dL — ABNORMAL LOW (ref 3.5–5.0)
Alkaline Phosphatase: 124 U/L (ref 38–126)
Anion gap: 12 (ref 5–15)
BUN: 46 mg/dL — ABNORMAL HIGH (ref 8–23)
CO2: 46 mmol/L — ABNORMAL HIGH (ref 22–32)
Calcium: 9 mg/dL (ref 8.9–10.3)
Chloride: 88 mmol/L — ABNORMAL LOW (ref 98–111)
Creatinine, Ser: 0.92 mg/dL (ref 0.61–1.24)
GFR calc Af Amer: >60 mL/min (ref >60)
GFR calc non Af Amer: >60 mL/min (ref >60)
Glucose, Bld: 172 mg/dL — ABNORMAL HIGH (ref 70–99)
Potassium: 3.1 mmol/L — ABNORMAL LOW (ref 3.5–5.1)
Sodium: 146 mmol/L — ABNORMAL HIGH (ref 135–145)
Total Bilirubin: 1.2 mg/dL (ref 0.3–1.2)
Total Protein: 5.6 g/dL — ABNORMAL LOW (ref 6.5–8.1)

## 2020-02-22 LAB — CBC
HCT: 38.1 % — ABNORMAL LOW (ref 39.0–52.0)
Hemoglobin: 11.5 g/dL — ABNORMAL LOW (ref 13.0–17.0)
MCH: 32 pg (ref 26.0–34.0)
MCHC: 30.2 g/dL (ref 30.0–36.0)
MCV: 106.1 fL — ABNORMAL HIGH (ref 80.0–100.0)
Platelets: 145 10*3/uL — ABNORMAL LOW (ref 150–400)
RBC: 3.59 MIL/uL — ABNORMAL LOW (ref 4.22–5.81)
RDW: 17.6 % — ABNORMAL HIGH (ref 11.5–15.5)
WBC: 14.1 10*3/uL — ABNORMAL HIGH (ref 4.0–10.5)
nRBC: 0.2 % (ref 0.0–0.2)

## 2020-02-22 LAB — MRSA PCR SCREENING: MRSA by PCR: NEGATIVE

## 2020-02-22 LAB — PROCALCITONIN: Procalcitonin: 0.36 ng/mL

## 2020-02-22 LAB — TRIGLYCERIDES: Triglycerides: 246 mg/dL — ABNORMAL HIGH (ref <150)

## 2020-02-22 MED ORDER — INSULIN ASPART 100 UNIT/ML ~~LOC~~ SOLN
2.0000 [IU] | SUBCUTANEOUS | Status: DC
  Administered 2020-02-22 – 2020-02-24 (×13): 2 [IU] via SUBCUTANEOUS

## 2020-02-22 MED ORDER — METHYLPREDNISOLONE SODIUM SUCC 125 MG IJ SOLR
60.0000 mg | Freq: Three times a day (TID) | INTRAMUSCULAR | Status: DC
  Administered 2020-02-22 – 2020-02-24 (×7): 60 mg via INTRAVENOUS
  Filled 2020-02-22 (×7): qty 2

## 2020-02-22 MED ORDER — METOLAZONE 5 MG PO TABS
5.0000 mg | ORAL_TABLET | Freq: Once | ORAL | Status: AC
  Administered 2020-02-22: 5 mg via ORAL
  Filled 2020-02-22: qty 1

## 2020-02-22 MED ORDER — ACETAZOLAMIDE 250 MG PO TABS
500.0000 mg | ORAL_TABLET | Freq: Once | ORAL | Status: AC
  Administered 2020-02-22: 500 mg
  Filled 2020-02-22: qty 2

## 2020-02-22 MED ORDER — POTASSIUM CHLORIDE 20 MEQ/15ML (10%) PO SOLN
40.0000 meq | ORAL | Status: AC
  Administered 2020-02-22 (×3): 40 meq via ORAL
  Filled 2020-02-22 (×3): qty 30

## 2020-02-22 MED ORDER — VANCOMYCIN HCL 2000 MG/400ML IV SOLN
2000.0000 mg | Freq: Once | INTRAVENOUS | Status: AC
  Administered 2020-02-22: 2000 mg via INTRAVENOUS
  Filled 2020-02-22: qty 400

## 2020-02-22 MED ORDER — SODIUM CHLORIDE 0.9 % IV SOLN
2.0000 g | Freq: Three times a day (TID) | INTRAVENOUS | Status: DC
  Administered 2020-02-22 – 2020-02-24 (×6): 2 g via INTRAVENOUS
  Filled 2020-02-22 (×6): qty 2

## 2020-02-22 MED ORDER — ACETAZOLAMIDE 250 MG PO TABS
500.0000 mg | ORAL_TABLET | Freq: Two times a day (BID) | ORAL | Status: AC
  Administered 2020-02-22 (×2): 500 mg via ORAL
  Filled 2020-02-22 (×2): qty 2

## 2020-02-22 MED ORDER — ACETAZOLAMIDE ORAL SUSPENSION 25 MG/ML: 4.0000 mg/kg | Freq: Once | ORAL | Status: DC

## 2020-02-22 MED ORDER — VANCOMYCIN HCL 1500 MG/300ML IV SOLN
1500.0000 mg | Freq: Two times a day (BID) | INTRAVENOUS | Status: DC
  Administered 2020-02-22 – 2020-02-23 (×3): 1500 mg via INTRAVENOUS
  Filled 2020-02-22 (×6): qty 300

## 2020-02-22 MED ORDER — FREE WATER
200.0000 mL | Status: DC
  Administered 2020-02-22 – 2020-02-24 (×13): 200 mL

## 2020-02-22 NOTE — Progress Notes (Signed)
Pharmacy Antibiotic Note  Gregory Harrington is a 64 y.o. male admitted on February 15, 2020 with hypoxia, found to have COVID-19.Patient is s/p remdesivir, steroids and actemra. He has worsening hypoxia today concerning for superimposed bacterial PNA .  Pharmacy has been consulted for vancomycin and cefepime dosing.  Population kinetics: Vd: 80L, Ke: 0.077 T1/2 9 hours, Estimated AUC 487, estimated trough 14. Will check trough concentration only if vancomycin is continued per the shortage of vancomycin reagents. Would hold off on linezolid switch at this time with platelets on lower end and high dose of fentanyl. Will order MRSA PCR to help de-escalate  Plan: Cefepime 2g IV q8h  Vancomycin 2g IV x1 Vancomycin 1500mg  IV q12h  Height: 5\' 11"  (180.3 cm) Weight: 110.9 kg (244 lb 7.8 oz) IBW/kg (Calculated) : 75.3  Temp (24hrs), Avg:98.2 F (36.8 C), Min:98 F (36.7 C), Max:98.5 F (36.9 C)  Recent Labs  Lab 02/18/20 0323 02/19/20 0500 02/20/20 0554 02/21/20 0608 02/22/20 0455  WBC 11.6* 9.9 10.2 11.1* 14.1*  CREATININE 0.98 0.87 0.77 0.84 0.92    Estimated Creatinine Clearance: 104 mL/min (by C-G formula based on SCr of 0.92 mg/dL).    No Known Allergies  Antimicrobials this admission: Completed azithro course as outpt Rocephin 4/4>>4/6 Vanc 4/5>>4/6 Ancef 4/6>> (4/10) Remdesivir 4/4 >> 4/8 Actemra 4/5 Cefepime 4/18>> Vancomycin 4/18>>  Microbiology results: 4/4 covid + 4/4 mrsa pcr - neg 4/4 BCx: 3 out of 4 MSSE 4/5 Sputum: normal flora 4/18 MRSA PCR: pend 4/18 TA: pend  Thank you for allowing pharmacy to be a part of this patient's care.  5/18 02/22/2020 9:36 AM

## 2020-02-22 NOTE — Progress Notes (Signed)
NAME:  Gregory Harrington, MRN:  [id number], DOB:  12/13/1955, LOS: 14 ADMISSION DATE:  02/28/2020, CONSULTATION DATE:  02/22/20 REFERRING MD: Lynelle Doctor , CHIEF COMPLAINT:  Hypoxia   Brief History   63 y.o. M with PMH of COPD who was diagnosed with Covid-19 on 3/26, presented with worsening hypoxia and multi-focal PNA requiring intubation.   Past Medical History   has a past medical history of COPD (chronic obstructive pulmonary disease) (HCC). Tobacco use (quit 2007)  Significant Hospital Events   4/4 Admit to PCCM and initiate proning  4/5 paralyse & prone 4/7-remains prone, hypoxemic 4/9-proning 4/10-still requiring proning 4/11-proning holiday yesterday 4/13-15-intermittent proning.  Largely ventilation defect.  Consults:    Procedures:   4/6 echo >>nml LV function  Micro Data:  4/4 POC Covid-19>>negative 4/4 BCx2>> MSSE 2/2 4/5 resp >> rare gram positive cocci, rare gram-positive rods-normal respiratory flora  Antimicrobials:  Azithromycin 4/4 only Ceftriaxone 4/4 -4/5 Vanc 4/5  Ancef 4/7-4/10  Vanc 4/18>> Cefepime 4/18>>   Interim history/subjective:  No events, remains on extremely high vent settings. Lasix gtt held for worsening metabolic alkalosis.  Objective   Blood pressure 108/79, pulse 81, temperature 98.5 F (36.9 C), temperature source Axillary, resp. rate (!) 29, height 5\' 11"  (1.803 m), weight 110.9 kg, SpO2 92 %.    Vent Mode: PRVC FiO2 (%):  [100 %] 100 % Set Rate:  [32 bmp] 32 bmp Vt Set:  [500 mL] 500 mL PEEP:  [18 cmH20] 18 cmH20 Plateau Pressure:  [28 cmH20-32 cmH20] 30 cmH20   Intake/Output Summary (Last 24 hours) at 02/22/2020 0723 Last data filed at 02/22/2020 0600 Gross per 24 hour  Intake 3690.39 ml  Output 5000 ml  Net -1309.61 ml   Filed Weights   02/20/20 0530 02/21/20 0429 02/22/20 0500  Weight: 111.2 kg 112.4 kg 110.9 kg   GEN: middle aged heavily sedated on vent HEENT: ETT in place, minimal secretions CV: RRR, ext warm PULM:  Rhonci on R GI: Soft, +BS EXT: diffuse anasarca NEURO: withdraws x 4 PSYCH: RASS -4 SKIN: No rashes  Sodium 150>>146 WBC 10>>11>>14 CBGs a little high, adding TF coverage + SSI Net -1.3L last 24h L radial a line PICC R arm Foley 7.5ETT   Resolved Hospital Problem list    MSSE bacteremia  -Completed Ancef -Significant leukocytosis initially, improved -Respiratory cultures-negative   Assessment & Plan:   Critically ill due to ARDS secondary to Covid-19 PNA require mechanical ventilation. Superimposed VTE likely.  COP and HCAP also in differential. - Wean FiO2 and PEEP per ARDSnet protocol.  - Keep negative fluid-wise while maintaining electrolyte balance - Will need trach eventually  TODAY: On iNO for refractory hypoxemia.  Will recheck CXR, Pct, tracheal aspirate, may consider IV steroids as salvage therapy for presumed post COVID-COP and throw on HCAP coverage for good measure as we are running out of options.  Keep working on diuresis.  Try proning again. Consider paralytics again. 02/19/2020 COVID-19 pneumonia -On dexamethasone -remdesivir completed -S/p Actemra -Scheduled neb bronchodilators'  Leg DVT- lovenox increased to full dose 4/17  Acute encephalopathy -Deep sedation with RASS goal of -5 -Weaning difficult given desaturations with awakening  Electrolyte disturbances, Hyperglycemia - Changes today include adding insulin aspart 2 units q4h standing, metolazone 5 mg x 1, diamox 500mg  bid x 2, decreasing free water from 300 mL q4h to 5/17 q4h, and continuing to hold lasix gtt while contraction alkalosis settles out a bit   Goals of care: keep pushing but if he  takes a turn for worse switch to palliation.   Best practice:  Diet: Tube feeds Pain/Anxiety/Delirium protocol (if indicated): Fentanyl, versed VAP protocol (if indicated): Bundle in place DVT prophylaxis: Lovenox GI prophylaxis: Protonix Glucose control: SSI Mobility: bed rest Code Status: full  code Family Communication: wife updated 4/18, understands we have now reached maximal supportive care Disposition: ICU  The patient is critically ill with multiple organ systems failure and requires high complexity decision making for assessment and support, frequent evaluation and titration of therapies, application of advanced monitoring technologies and extensive interpretation of multiple databases. Critical Care Time devoted to patient care services described in this note independent of APP/resident time (if applicable)  is 46 minutes.   Erskine Emery MD [facility] 02/22/2020 7:23 AM Personal pager: [phone] If unanswered, please page CCM On-call: [phone]

## 2020-02-22 NOTE — Progress Notes (Signed)
Patient's head repositioned and arms rotated.  Patient tolerated well.  No skin breakdown noted at this time.    

## 2020-02-22 NOTE — Progress Notes (Signed)
Assisted tele visit to patient with family member.  Ross Bender Ann, RN  

## 2020-02-22 NOTE — Progress Notes (Signed)
Patient placed in prone position.  ETT secured with cloth tape at 25 cm at the lip.  Patient tolerated well with no complications.  

## 2020-02-22 NOTE — Progress Notes (Signed)
eLink Physician-Brief Progress Note Patient Name: Gregory Harrington DOB: December 07, 1955 MRN: [id number]   Date of Service  02/22/2020  HPI/Events of Note  ABG on 100%/PRVC 32/TV 500/P 18 = 7.50/68/66/54. Patient is not on a NaHCO3 IV infusion, however, he is on a Lasix IV infusion.   eICU Interventions  Will order: 1. Hold Lasix IV infusion until patient is re-evaluated by the rounding team in the AM. 2. Diamox 450 mg per tube X 1 now.      Intervention Category Major Interventions: Acid-Base disturbance - evaluation and management;Respiratory failure - evaluation and management  Lenell Antu 02/22/2020, 3:53 AM

## 2020-02-23 ENCOUNTER — Inpatient Hospital Stay (HOSPITAL_COMMUNITY): Payer: BC Managed Care – PPO

## 2020-02-23 DIAGNOSIS — U071 COVID-19: Secondary | ICD-10-CM | POA: Diagnosis not present

## 2020-02-23 DIAGNOSIS — J1282 Pneumonia due to coronavirus disease 2019: Secondary | ICD-10-CM | POA: Diagnosis not present

## 2020-02-23 DIAGNOSIS — R918 Other nonspecific abnormal finding of lung field: Secondary | ICD-10-CM | POA: Diagnosis not present

## 2020-02-23 LAB — CBC
HCT: 33.4 % — ABNORMAL LOW (ref 39.0–52.0)
Hemoglobin: 10 g/dL — ABNORMAL LOW (ref 13.0–17.0)
MCH: 32.1 pg (ref 26.0–34.0)
MCHC: 29.9 g/dL — ABNORMAL LOW (ref 30.0–36.0)
MCV: 107.1 fL — ABNORMAL HIGH (ref 80.0–100.0)
Platelets: 154 10*3/uL (ref 150–400)
RBC: 3.12 MIL/uL — ABNORMAL LOW (ref 4.22–5.81)
RDW: 17.1 % — ABNORMAL HIGH (ref 11.5–15.5)
WBC: 13.5 10*3/uL — ABNORMAL HIGH (ref 4.0–10.5)
nRBC: 0.1 % (ref 0.0–0.2)

## 2020-02-23 LAB — COMPREHENSIVE METABOLIC PANEL
ALT: 94 U/L — ABNORMAL HIGH (ref 0–44)
AST: 46 U/L — ABNORMAL HIGH (ref 15–41)
Albumin: 2.3 g/dL — ABNORMAL LOW (ref 3.5–5.0)
Alkaline Phosphatase: 114 U/L (ref 38–126)
Anion gap: 13 (ref 5–15)
BUN: 62 mg/dL — ABNORMAL HIGH (ref 8–23)
CO2: 39 mmol/L — ABNORMAL HIGH (ref 22–32)
Calcium: 8.9 mg/dL (ref 8.9–10.3)
Chloride: 90 mmol/L — ABNORMAL LOW (ref 98–111)
Creatinine, Ser: 1.08 mg/dL (ref 0.61–1.24)
GFR calc Af Amer: >60 mL/min (ref >60)
GFR calc non Af Amer: >60 mL/min (ref >60)
Glucose, Bld: 264 mg/dL — ABNORMAL HIGH (ref 70–99)
Potassium: 3.5 mmol/L (ref 3.5–5.1)
Sodium: 142 mmol/L (ref 135–145)
Total Bilirubin: 1 mg/dL (ref 0.3–1.2)
Total Protein: 5.5 g/dL — ABNORMAL LOW (ref 6.5–8.1)

## 2020-02-23 LAB — GLUCOSE, CAPILLARY
Glucose-Capillary: 266 mg/dL — ABNORMAL HIGH (ref 70–99)
Glucose-Capillary: 214 mg/dL — ABNORMAL HIGH (ref 70–99)
Glucose-Capillary: 246 mg/dL — ABNORMAL HIGH (ref 70–99)
Glucose-Capillary: 219 mg/dL — ABNORMAL HIGH (ref 70–99)
Glucose-Capillary: 259 mg/dL — ABNORMAL HIGH (ref 70–99)
Glucose-Capillary: 311 mg/dL — ABNORMAL HIGH (ref 70–99)

## 2020-02-23 LAB — POCT I-STAT 7, (LYTES, BLD GAS, ICA,H+H)
Acid-Base Excess: 15 mmol/L — ABNORMAL HIGH (ref 0.0–2.0)
Bicarbonate: 43.6 mmol/L — ABNORMAL HIGH (ref 20.0–28.0)
Calcium, Ion: 1.19 mmol/L (ref 1.15–1.40)
HCT: 31 % — ABNORMAL LOW (ref 39.0–52.0)
Hemoglobin: 10.5 g/dL — ABNORMAL LOW (ref 13.0–17.0)
O2 Saturation: 90 %
Patient temperature: 97.7
Potassium: 3.4 mmol/L — ABNORMAL LOW (ref 3.5–5.1)
Sodium: 140 mmol/L (ref 135–145)
TCO2: 46 mmol/L — ABNORMAL HIGH (ref 22–32)
pCO2 arterial: 80.3 mmHg (ref 32.0–48.0)
pH, Arterial: 7.341 — ABNORMAL LOW (ref 7.350–7.450)
pO2, Arterial: 65 mmHg — ABNORMAL LOW (ref 83.0–108.0)

## 2020-02-23 MED ORDER — INSULIN GLARGINE 100 UNIT/ML ~~LOC~~ SOLN
10.0000 [IU] | Freq: Once | SUBCUTANEOUS | Status: AC
  Administered 2020-02-23: 10 [IU] via SUBCUTANEOUS
  Filled 2020-02-23: qty 0.1

## 2020-02-23 MED ORDER — INSULIN GLARGINE 100 UNIT/ML ~~LOC~~ SOLN
10.0000 [IU] | Freq: Every day | SUBCUTANEOUS | Status: DC
  Filled 2020-02-23: qty 0.1

## 2020-02-23 NOTE — Progress Notes (Signed)
Report called to Raynelle Fanning, RN for transfer to 58M bed 07.  Pt transported with RT.  Informed Geordie Nooney, pt was in his new room and I relayed the message to Raynelle Fanning that Darl Pikes would like to video chat.

## 2020-02-23 NOTE — Progress Notes (Signed)
Assisted tele visit to patient with wife.  Gregory Harrington M Eriyonna Matsushita, RN   

## 2020-02-23 NOTE — Progress Notes (Signed)
Patient rec'd from 94M, report rec'd, no change in patient condition from previous assessments. Respiratory therapy supporting transfer. Orders reviewed and noted.

## 2020-02-23 NOTE — Progress Notes (Signed)
NAME:  Gregory Harrington, MRN:  [id number], DOB:  09-Feb-1956, LOS: 15 ADMISSION DATE:  02/12/2020, CONSULTATION DATE:  02/23/20 REFERRING MD: Lynelle Doctor , CHIEF COMPLAINT:  Hypoxia   Brief History   64 y.o. M with PMH of COPD who was diagnosed with Covid-19 on 3/26, presented with worsening hypoxia and multi-focal PNA requiring intubation.   Past Medical History   has a past medical history of COPD (chronic obstructive pulmonary disease) (HCC). Tobacco use (quit 2007)  Significant Hospital Events   4/4 Admit to PCCM and initiate proning  4/5 paralyse & prone 4/7-remains prone, hypoxemic 4/9-proning 4/10-still requiring proning 4/11-proning holiday yesterday 4/13-15-intermittent proning.  Largely ventilation defect.  Consults:    Procedures:  4/6 echo>> nml LV function 4/17 LE venous doppler: RIGHT:  - Findings consistent with acute deep vein thrombosis involving the right  posterior tibial veins.  Hematoma noted in groin    LEFT:  - Findings consistent with acute deep vein thrombosis involving the left  peroneal veins.  Micro Data:  4/4 POC Covid-19>>negative 4/4 BCx2>> MSSE 2/2 4/5 resp >> rare gram positive cocci, rare gram-positive rods-normal respiratory flora  Antimicrobials:  Azithromycin 4/4 only Ceftriaxone 4/4 -4/5 Vanc 4/5  Ancef 4/7-4/10  Vanc 4/18>> Cefepime 4/18>>   Interim history/subjective:  4/19: cuff leak this am preventing tv. cxr showed ett high. Advanced and good TV returned and cuff no longer audible. Remains on iNO 4/18:No events, remains on extremely high vent settings. Lasix gtt held for worsening metabolic alkalosis.  Objective   Blood pressure 132/72, pulse 60, temperature 98.2 F (36.8 C), temperature source Axillary, resp. rate (!) 32, height 5\' 11"  (1.803 m), weight 109.9 kg, SpO2 (!) 88 %.    Vent Mode: PRVC FiO2 (%):  [100 %] 100 % Set Rate:  [32 bmp] 32 bmp Vt Set:  [500 mL] 500 mL PEEP:  [18 cmH20] 18 cmH20 Plateau Pressure:  [29  cmH20-30 cmH20] 30 cmH20   Intake/Output Summary (Last 24 hours) at 02/23/2020 1227 Last data filed at 02/23/2020 1139 Gross per 24 hour  Intake 3482.75 ml  Output 2770 ml  Net 712.75 ml   Filed Weights   02/21/20 0429 02/22/20 0500 02/23/20 0500  Weight: 112.4 kg 110.9 kg 109.9 kg   GEN: middle aged heavily sedated on vent ett in place HEENT: NCAT, PERRL minimal secretions CV: RRR, ext warm PULM: Rhonchi on R GI: Soft, +BS EXT: diffuse anasarca NEURO: withdraws x 4 PSYCH: RASS -4 SKIN: No rashes   L radial a line PICC R arm Foley 7.5ETT   Resolved Hospital Problem list    MSSE bacteremia  -Completed Ancef -Significant leukocytosis initially, improved -Respiratory cultures-negative   Assessment & Plan:   Critically ill due to ARDS secondary to Covid-19 PNA require mechanical ventilation. Superimposed VTE likely.  COP and HCAP also in differential. - Wean FiO2 and PEEP per ARDSnet protocol.  - Keep negative fluid-wise while maintaining electrolyte balance, holding for now with contraction alkalosis (improved post diamox) - Will need trach eventually -cont iNO for refractory hypoxemia.   -pct improved 0.51->0.36 - 4/18 tracheal aspirate and gpc and gnr: cont empiric abx.  -remains on 100%  -supinated at 0200 this am post abg poor will reprone pending discussion with wife.   08-01-2003 COVID-19 pneumonia -completed decadron, now on salvage steroids -remdesivir completed -S/p Actemra -Scheduled neb bronchodilators  B/L LE DVT- lovenox increased to full dose 4/17  Acute encephalopathy -Deep sedation with RASS goal of -5 -Weaning difficult given desaturations with  awakening  Transaminitis:  Improving  Prediabetes with Hyperglycemia - elevated with elevated steroids -add lantus  Goals of care: keep pushing but if he takes a turn for worse switch to palliation.   Best practice:  Diet: Tube feeds Pain/Anxiety/Delirium protocol (if indicated): Fentanyl,  versed VAP protocol (if indicated): Bundle in place DVT prophylaxis: Lovenox GI prophylaxis: Protonix Glucose control: SSI Mobility: bed rest Code Status: full code Family Communication: wife updated 4/18, understands we have now reached maximal supportive care Disposition: ICU  Critical care time: The patient is critically ill with multiple organ systems failure and requires high complexity decision making for assessment and support, frequent evaluation and titration of therapies, application of advanced monitoring technologies and extensive interpretation of multiple databases.  Critical care time 49 mins. This represents my time independent of the NPs time taking care of the pt. This is excluding procedures.    [facility] 02/23/2020, 12:27 PM

## 2020-02-23 NOTE — Progress Notes (Addendum)
Spoke with pt's wife who is clear on patients previously expressed wishes. We discussed is current critical condition and is low chance of survival. We discussed that if he was able to improve he would most certainly need a trach/long term vent wean and nursing home placement. She endorsed that is not a quality of life that the pt would find acceptable.   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   We will move the patient to 83M as his first positive covid was 3/26 and she will be able to visit with pt prior to extubation.   This was d/w RN as well.  Pt remains DNR

## 2020-02-23 NOTE — Consult Note (Addendum)
WOC Nurse Consult Note: Reason for Consult: Previously noted Deep tissue pressure injuries (DTPI) to mouth x 2. on right upper lip and left lower lip have resolved when assessed this morning. No further role for WOC team. Please re-consult if further assistance is needed.  Thank-you,  Cammie Mcgee MSN, RN, CWOCN, [place], CNS [phone]

## 2020-02-23 NOTE — Progress Notes (Signed)
RT NOTE: ETT advanced 2cm to 27 at the lips per MD order per chest xray. Vitals are stable. RT will continue to monitor.

## 2020-02-23 NOTE — Progress Notes (Signed)
Pt flipped back supine, SATs remain stable at this time RT will continue to monitor.

## 2020-02-23 NOTE — Progress Notes (Signed)
Assisted wife with camera/video time via elink 

## 2020-02-23 NOTE — Progress Notes (Signed)
Assisted tele visit to patient with friend.  Vena Austria, RN

## 2020-02-24 DIAGNOSIS — U071 COVID-19: Secondary | ICD-10-CM | POA: Diagnosis not present

## 2020-02-24 DIAGNOSIS — J1282 Pneumonia due to coronavirus disease 2019: Secondary | ICD-10-CM | POA: Diagnosis not present

## 2020-02-24 LAB — HEPATIC FUNCTION PANEL
ALT: 75 U/L — ABNORMAL HIGH (ref 0–44)
AST: 36 U/L (ref 15–41)
Albumin: 2.1 g/dL — ABNORMAL LOW (ref 3.5–5.0)
Alkaline Phosphatase: 97 U/L (ref 38–126)
Bilirubin, Direct: 0.2 mg/dL (ref 0.0–0.2)
Indirect Bilirubin: 1 mg/dL — ABNORMAL HIGH (ref 0.3–0.9)
Total Bilirubin: 1.2 mg/dL (ref 0.3–1.2)
Total Protein: 5.4 g/dL — ABNORMAL LOW (ref 6.5–8.1)

## 2020-02-24 LAB — CBC
HCT: 29.2 % — ABNORMAL LOW (ref 39.0–52.0)
Hemoglobin: 9.2 g/dL — ABNORMAL LOW (ref 13.0–17.0)
MCH: 33.5 pg (ref 26.0–34.0)
MCHC: 31.5 g/dL (ref 30.0–36.0)
MCV: 106.2 fL — ABNORMAL HIGH (ref 80.0–100.0)
Platelets: 173 10*3/uL (ref 150–400)
RBC: 2.75 MIL/uL — ABNORMAL LOW (ref 4.22–5.81)
RDW: 17.1 % — ABNORMAL HIGH (ref 11.5–15.5)
WBC: 16.4 10*3/uL — ABNORMAL HIGH (ref 4.0–10.5)
nRBC: 0.4 % — ABNORMAL HIGH (ref 0.0–0.2)

## 2020-02-24 LAB — BASIC METABOLIC PANEL
Anion gap: 14 (ref 5–15)
BUN: 76 mg/dL — ABNORMAL HIGH (ref 8–23)
CO2: 37 mmol/L — ABNORMAL HIGH (ref 22–32)
Calcium: 8.4 mg/dL — ABNORMAL LOW (ref 8.9–10.3)
Chloride: 85 mmol/L — ABNORMAL LOW (ref 98–111)
Creatinine, Ser: 1.1 mg/dL (ref 0.61–1.24)
GFR calc Af Amer: >60 mL/min (ref >60)
GFR calc non Af Amer: >60 mL/min (ref >60)
Glucose, Bld: 305 mg/dL — ABNORMAL HIGH (ref 70–99)
Potassium: 3.1 mmol/L — ABNORMAL LOW (ref 3.5–5.1)
Sodium: 136 mmol/L (ref 135–145)

## 2020-02-24 LAB — POCT I-STAT 7, (LYTES, BLD GAS, ICA,H+H)
Acid-Base Excess: 13 mmol/L — ABNORMAL HIGH (ref 0.0–2.0)
Bicarbonate: 40.9 mmol/L — ABNORMAL HIGH (ref 20.0–28.0)
Calcium, Ion: 1.16 mmol/L (ref 1.15–1.40)
HCT: 27 % — ABNORMAL LOW (ref 39.0–52.0)
Hemoglobin: 9.2 g/dL — ABNORMAL LOW (ref 13.0–17.0)
O2 Saturation: 86 %
Patient temperature: 98.4
Potassium: 3.1 mmol/L — ABNORMAL LOW (ref 3.5–5.1)
Sodium: 138 mmol/L (ref 135–145)
TCO2: 43 mmol/L — ABNORMAL HIGH (ref 22–32)
pCO2 arterial: 74.4 mmHg (ref 32.0–48.0)
pH, Arterial: 7.348 — ABNORMAL LOW (ref 7.350–7.450)
pO2, Arterial: 57 mmHg — ABNORMAL LOW (ref 83.0–108.0)

## 2020-02-24 LAB — GLUCOSE, CAPILLARY
Glucose-Capillary: 310 mg/dL — ABNORMAL HIGH (ref 70–99)
Glucose-Capillary: 292 mg/dL — ABNORMAL HIGH (ref 70–99)

## 2020-02-24 MED ORDER — HALOPERIDOL LACTATE 5 MG/ML IJ SOLN: 0.5000 mg | INTRAMUSCULAR | Status: DC | PRN

## 2020-02-24 MED ORDER — FUROSEMIDE 10 MG/ML IJ SOLN
80.0000 mg | Freq: Once | INTRAMUSCULAR | Status: AC
  Administered 2020-02-24: 80 mg via INTRAVENOUS
  Filled 2020-02-24: qty 8

## 2020-02-24 MED ORDER — HALOPERIDOL 0.5 MG PO TABS
0.5000 mg | ORAL_TABLET | ORAL | Status: DC | PRN
  Filled 2020-02-24: qty 1

## 2020-02-24 MED ORDER — ONDANSETRON 4 MG PO TBDP: 4.0000 mg | ORAL_TABLET | Freq: Four times a day (QID) | ORAL | Status: DC | PRN

## 2020-02-24 MED ORDER — MORPHINE 100MG IN NS 100ML (1MG/ML) PREMIX INFUSION
5.0000 mg/h | INTRAVENOUS | Status: DC
  Administered 2020-02-24: 5 mg/h via INTRAVENOUS
  Filled 2020-02-24: qty 100

## 2020-02-24 MED ORDER — BIOTENE DRY MOUTH MT LIQD: 15.0000 mL | OROMUCOSAL | Status: DC | PRN

## 2020-02-24 MED ORDER — ACETAMINOPHEN 325 MG PO TABS: 650.0000 mg | ORAL_TABLET | Freq: Four times a day (QID) | ORAL | Status: DC | PRN

## 2020-02-24 MED ORDER — GLYCOPYRROLATE 0.2 MG/ML IJ SOLN: 0.2000 mg | INTRAMUSCULAR | Status: DC | PRN

## 2020-02-24 MED ORDER — MORPHINE BOLUS VIA INFUSION
1.0000 mg | INTRAVENOUS | Status: DC | PRN
  Filled 2020-02-24: qty 1

## 2020-02-24 MED ORDER — POLYVINYL ALCOHOL 1.4 % OP SOLN
1.0000 [drp] | Freq: Four times a day (QID) | OPHTHALMIC | Status: DC | PRN
  Filled 2020-02-24: qty 15

## 2020-02-24 MED ORDER — ACETAMINOPHEN 650 MG RE SUPP: 650.0000 mg | Freq: Four times a day (QID) | RECTAL | Status: DC | PRN

## 2020-02-24 MED ORDER — ONDANSETRON HCL 4 MG/2ML IJ SOLN: 4.0000 mg | Freq: Four times a day (QID) | INTRAMUSCULAR | Status: DC | PRN

## 2020-02-24 MED ORDER — GLYCOPYRROLATE 1 MG PO TABS: 1.0000 mg | ORAL_TABLET | ORAL | Status: DC | PRN

## 2020-02-24 MED ORDER — HALOPERIDOL LACTATE 2 MG/ML PO CONC
0.5000 mg | ORAL | Status: DC | PRN
  Filled 2020-02-24: qty 0.3

## 2020-02-25 LAB — CULTURE, RESPIRATORY W GRAM STAIN

## 2020-03-06 NOTE — Procedures (Signed)
Extubation Procedure Note  Patient Details:   Name: Gregory Harrington DOB: Oct 29, 1956 MRN: [id number]   Airway Documentation:    Vent end date: 03-18-2020 Vent end time: 1417   Evaluation  O2 sats: terminal extubation  Complications: No apparent complications Patient did tolerate procedure well. Bilateral Breath Sounds: Diminished   No   Pt was terminally extubated per order.   Merlene Laughter 03/18/2020, 2:18 PM

## 2020-03-06 NOTE — Progress Notes (Signed)
eLink Physician-Brief Progress Note Patient Name: Gregory Harrington DOB: 07-19-1956 MRN: [id number]   Date of Service  03/03/2020  HPI/Events of Note  ABG on 100%/PRVC 32/TV 500/P 18 = 7.348/74.4/57.0  eICU Interventions  Continue present ventilator management.      Intervention Category Major Interventions: Respiratory failure - evaluation and management  [name] 02/07/2020, 4:02 AM

## 2020-03-06 NOTE — Progress Notes (Signed)
Nutrition Brief Note  Chart reviewed. Pt now transitioning to comfort care.  No further nutrition interventions planned at this time.  Please re-consult as needed.   Linell Shawn P., RD, LDN, CNSC See AMiON for contact information    

## 2020-03-06 NOTE — Progress Notes (Signed)
Wasted 35 ml of fentanyl with Kellie Shropshire RN in stericycle

## 2020-03-06 NOTE — Progress Notes (Signed)
Spoke with E-link RN about Pt's Sp02 down in the low 80's after pt's bath.  Pt is on 100% Fio2, 18 of PEEP and  Nitric 20 ppm. Awaiting to see if any changes need to be made.

## 2020-03-06 NOTE — Progress Notes (Signed)
NAME:  Gregory Harrington, MRN:  [id number], DOB:  02-04-1956, LOS: 16 ADMISSION DATE:  02/22/2020, CONSULTATION DATE:  02-28-2020 REFERRING MD: Lynelle Doctor , CHIEF COMPLAINT:  Hypoxia   Brief History   64 y.o. M with PMH of COPD who was diagnosed with Covid-19 on 3/26, presented with worsening hypoxia and multi-focal PNA requiring intubation.   Past Medical History   has a past medical history of COPD (chronic obstructive pulmonary disease) (HCC). Tobacco use (quit 2007)  Significant Hospital Events   4/4 Admit to PCCM and initiate proning  4/5 paralyse & prone 4/7-remains prone, hypoxemic 4/9-proning 4/10-still requiring proning 4/11-proning holiday yesterday 4/13-15-intermittent proning.  Largely ventilation defect. 4/19 GOC discussion with the wife, plan to transition to comfort  Consults:  None  Procedures:  4/6 echo>> nml LV function 4/17 LE venous doppler: RIGHT:  - Findings consistent with acute deep vein thrombosis involving the right  posterior tibial veins.  Hematoma noted in groin    LEFT:  - Findings consistent with acute deep vein thrombosis involving the left  peroneal veins.  Micro Data:  4/4 POC Covid-19>>negative 4/4 BCx2>> MSSE 2/2 4/5 resp >> rare gram positive cocci, rare gram-positive rods-normal respiratory flora  Antimicrobials:  Azithromycin 4/4 only Ceftriaxone 4/4 -4/5 Vanc 4/5  Ancef 4/7-4/10  Vanc 4/18>> Cefepime 4/18>>   Interim history/subjective:  4/19: cuff leak this am preventing tv. cxr showed ett high. Advanced and good TV returned and cuff no longer audible. Remains on iNO 4/18:No events, remains on extremely high vent settings. Lasix gtt held for worsening metabolic alkalosis.  Objective   Blood pressure (!) 145/59, pulse 60, temperature 98.4 F (36.9 C), temperature source Axillary, resp. rate (!) 32, height 5\' 11"  (1.803 m), weight 110 kg, SpO2 (!) 88 %. CVP:  [14 mmHg-16 mmHg] 14 mmHg  Vent Mode: PRVC FiO2 (%):  [100 %] 100 % Set  Rate:  [32 bmp] 32 bmp Vt Set:  [500 mL] 500 mL PEEP:  [18 cmH20] 18 cmH20 Plateau Pressure:  [29 cmH20-32 cmH20] 30 cmH20   Intake/Output Summary (Last 24 hours) at 2020-02-28 0629 Last data filed at 02-28-20 0500 Gross per 24 hour  Intake 3065.14 ml  Output 2125 ml  Net 940.14 ml   Filed Weights   02/22/20 0500 02/23/20 0500 28-Feb-2020 0500  Weight: 110.9 kg 109.9 kg 110 kg   GEN: middle aged heavily sedated on vent ett in place HEENT: NCAT, PERRL minimal secretions CV: RRR, ext warm PULM: Rhonchi on R GI: Soft, +BS EXT: diffuse anasarca NEURO: withdraws x 4 PSYCH: RASS -4 SKIN: No rashes  L radial a line PICC R arm Foley 7.5ETT  Resolved Hospital Problem list    MSSE bacteremia  - Completed Ancef - Significant leukocytosis initially, improved - Respiratory cultures-negative   Assessment & Plan:   GOC  - Dr. 02/26/20 spoke with the patient's wife yesterday. They discussed the patient's critical situation and failure to improve. Discussed that he will most certainly need a trach/long term vent and nursing home placement. After consideration the family would like to transition to comfort.   Will verify with family. If confirmed: - After video chat with patient's son today we will transition to comfort measures.  - No further lab draws of needle sticks  - Discontinue tube feeds  - Stop Lovenox  - Continue Fentanyl for air hunger  - Plan to extubate once family has had time to say goodbye - Please allow visitors at bedside    Critically ill due  to ARDS secondary to Covid-19 PNA require mechanical ventilation. Superimposed VTE likely.  COP and HCAP also in differential. - S/p Decadron, Remdesivir, and Actemra without significant improvement  - Remains on salvage steroids  - Plans are to transition to comfort as outlined above.   B/L LE DVT- lovenox increased to full dose 4/17 - Plans are to transition to comfort as outlined above.   Acute encephalopathy - Plans  are to transition to comfort as outlined above.   Transaminitis:  - Improving  Prediabetes with Hyperglycemia - Remain above goal  - No changes as we are transitioning to comfort   Best practice:  Diet: Tube feeds Pain/Anxiety/Delirium protocol (if indicated): Fentanyl, versed VAP protocol (if indicated): Bundle in place DVT prophylaxis: Lovenox GI prophylaxis: Protonix Glucose control: SSI Mobility: bed rest Code Status: full code Family Communication: Will call family Disposition: ICU     Ina Homes, MD  [facility] PGY3  Pager: [phone] 03/18/20, 6:29 AM

## 2020-03-06 NOTE — Discharge Summary (Signed)
  Name: Gregory Harrington MRN: [id number] DOB: 01-Apr-1956 64 y.o.  Date of Admission: 03-05-20  6:28 PM Date of Discharge: 02/25/2020 Attending Physician: Lynnell Catalan, MD  Discharge Diagnosis: Active Problems:   Respiratory failure (HCC)   Sepsis due to COVID-19 [facility])  Cause of death: Respiratory failure due to COVID-19 Pneumonia  Time of death: 1440  Disposition and follow-up:   [name] was discharged from [facility] in expired condition.    Hospital Course: Gregory Harrington was a 63 y.o male with a history of COPD who presented to the ED on 4/4 with acute hypoxic respiratory failure. He was found to have COVID-19 pneumonia. He required urgent intubation and was treated for severe ARDS with paralytics and proning. He was treated with Decadron, Remdesivir, and Actemra without significant improvement and continued to require full vent support. Upon discussion with the family they elected to pursue comfort measures. The patient was started on morphine and terminally extubated on 03-22-23. He subsequently died at 1440 at Mar 21, 2020.  Signed: Levora Dredge, MD 02/25/2020, 7:49 AM

## 2020-03-06 NOTE — Progress Notes (Addendum)
eLink Physician-Brief Progress Note Patient Name: Gregory Harrington DOB: 1956/10/15 MRN: [id number]   Date of Service  03/16/2020  HPI/Events of Note  Hypoxia - Sat dropped to 83% after bath. Now sat = 86% to 87%. He is ahead by 10.3 liter since admission.   eICU Interventions  Plan: 1. Check CVP. IF CVP > 10 --> will diurese.      Intervention Category Major Interventions: Hypoxemia - evaluation and management  Ikesha Siller Eugene 03-16-2020, 1:02 AM

## 2020-03-06 NOTE — Progress Notes (Signed)
Time of death 1440, verified with Jamison Oka RN, MD notified

## 2020-03-06 DEATH — deceased

## 2022-01-10 IMAGING — DX DG CHEST 1V PORT
2 series · 2 of 2 positions shown · non-contrast
Comparison: February 16, 2020

CLINICAL DATA: Hypoxia

EXAM:
PORTABLE CHEST 1 VIEW

[chest ap (1 of 2)]
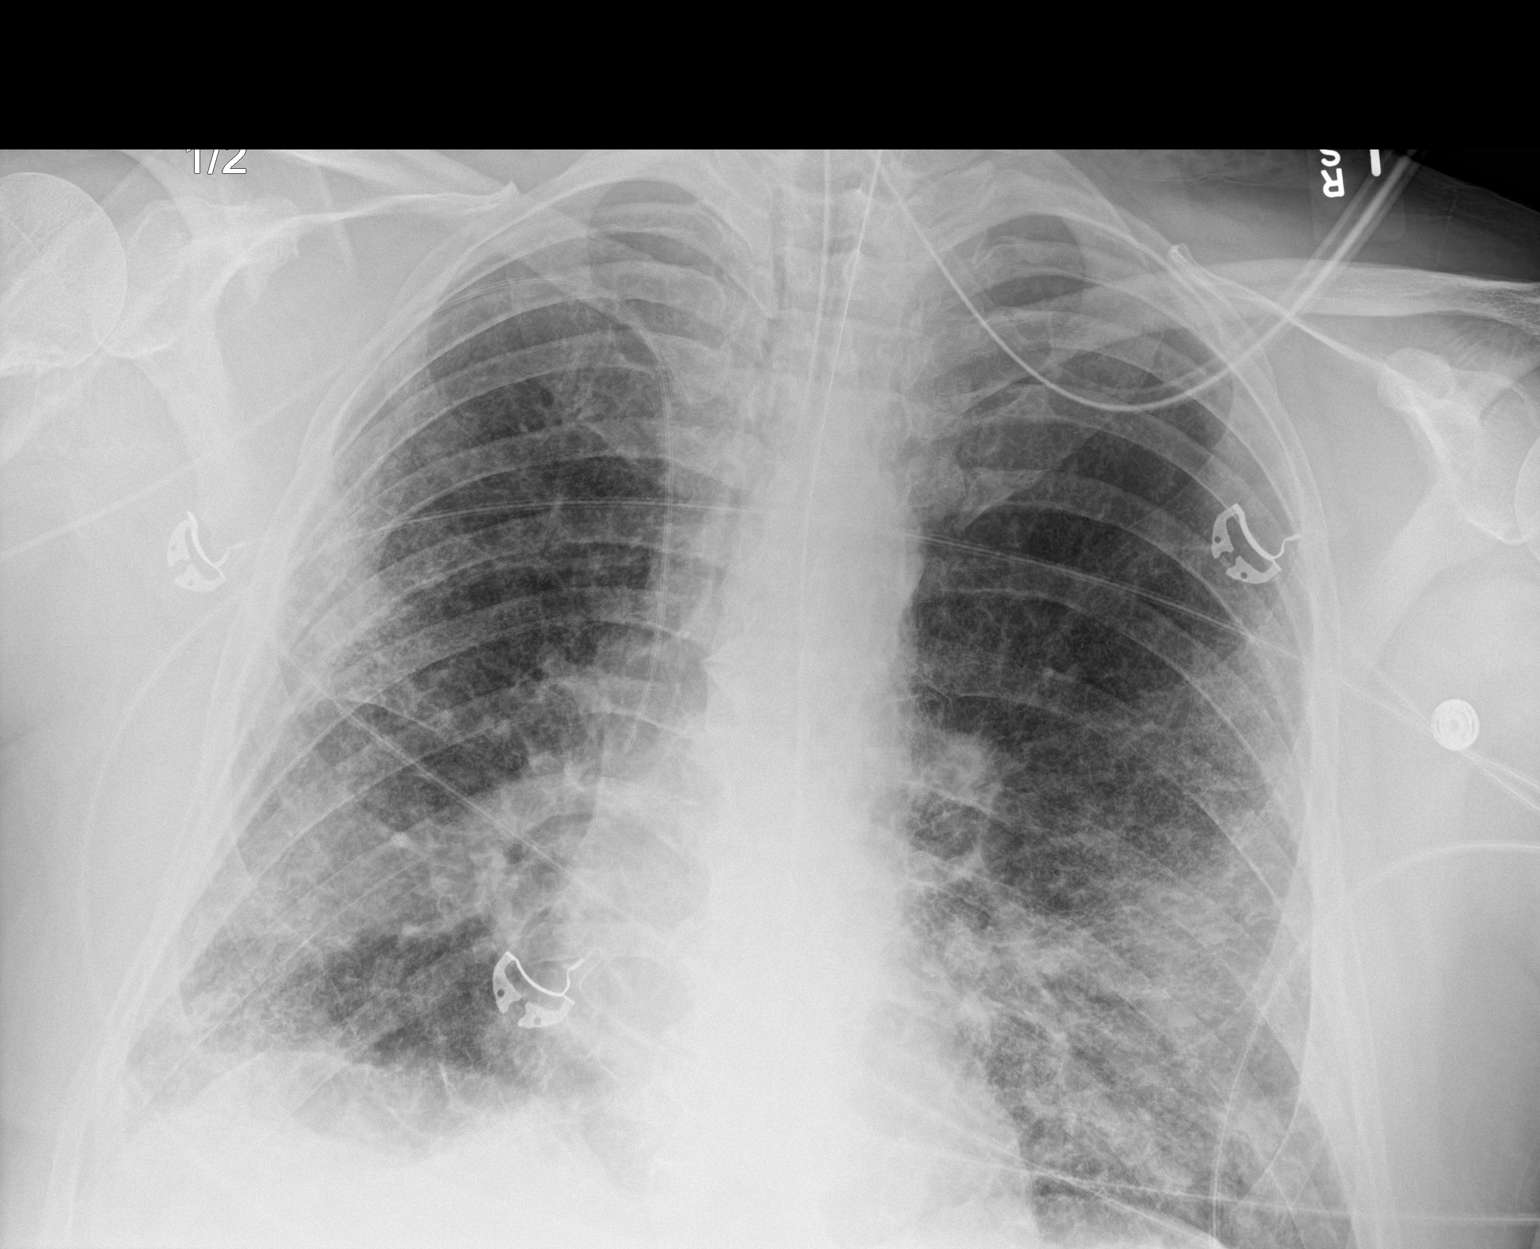

[chest ap (2 of 2)]
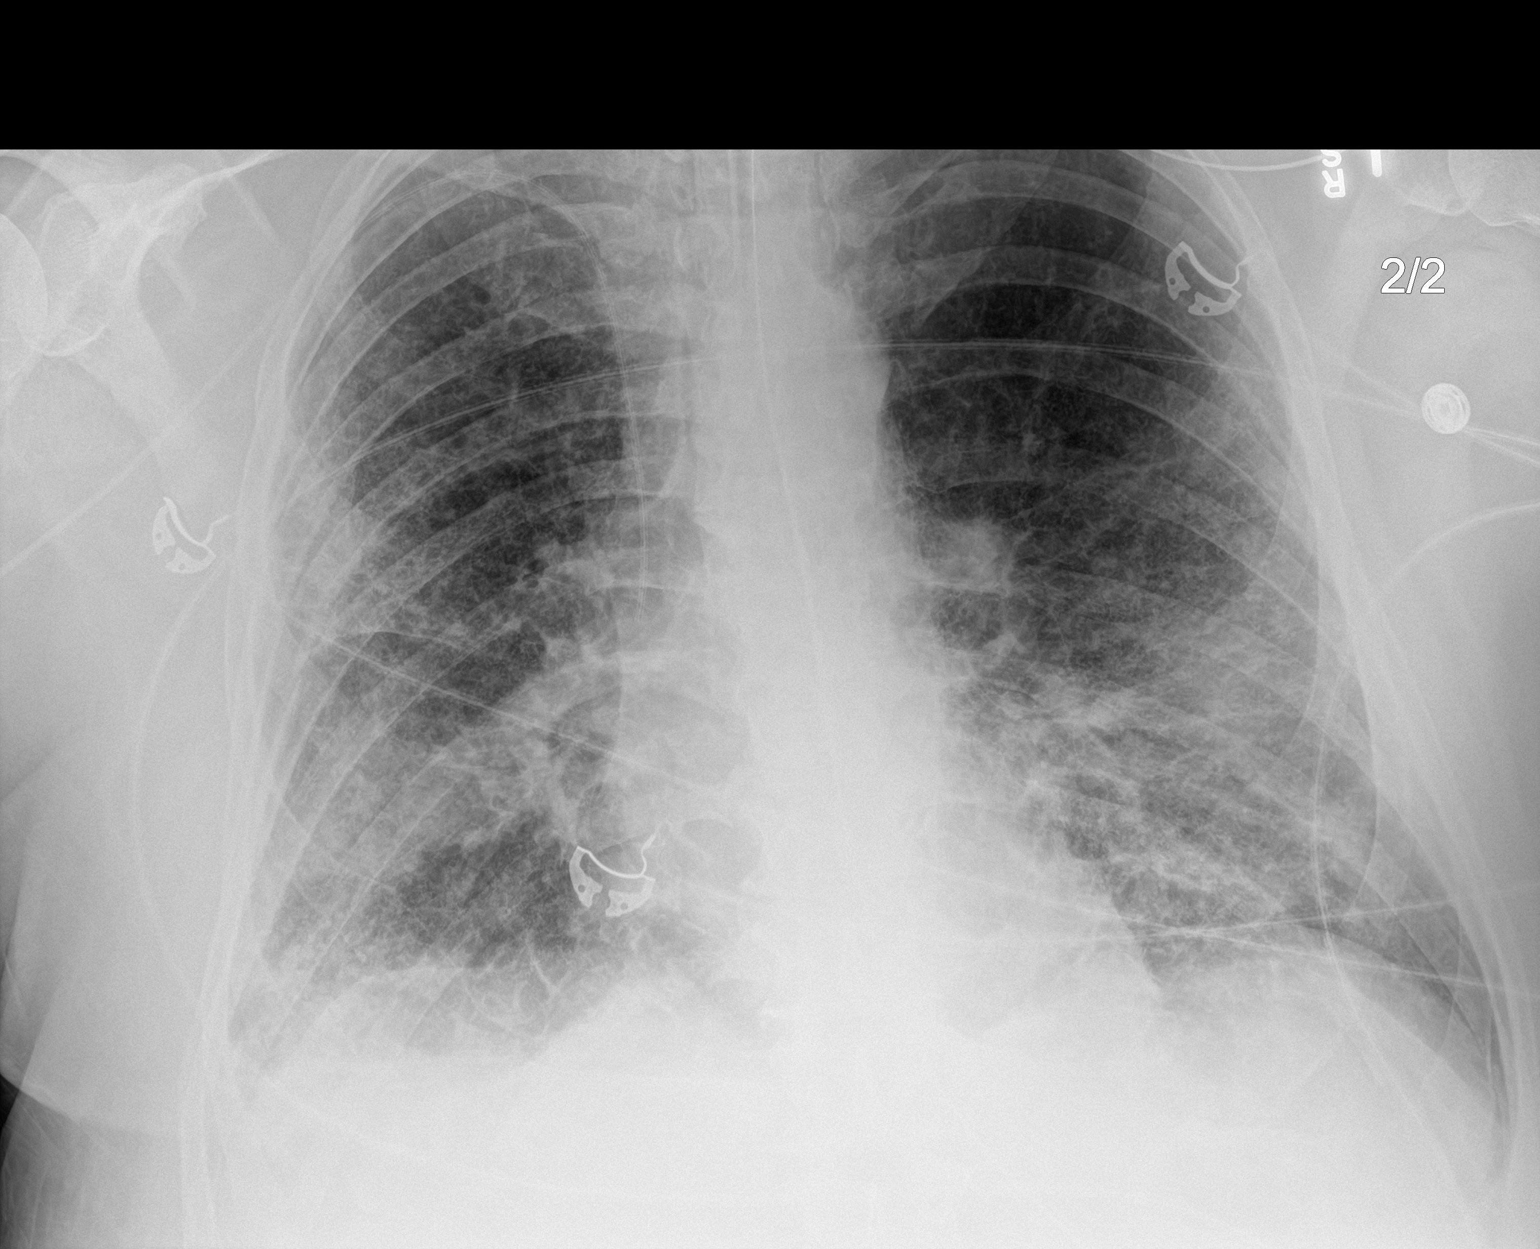

[2 of 2 positions shown; findings below may reference images not displayed]

FINDINGS: Endotracheal tube tip is 5.9 cm above the carina. Nasogastric tube
tip and side port are below the diaphragm. Central catheter tip is
in the superior vena cava. No pneumothorax.

There is persistent airspace opacity throughout both mid and lower
lung zone regions. Suspect small right pleural effusion. No new
opacity evident. Heart size within normal limits. Pulmonary
vascularity is stable and reflective of a degree of underlying
emphysematous change. No adenopathy. No bone lesions.
IMPRESSION: Tube and catheter positions as described without pneumothorax.
Underlying emphysematous change. Multifocal airspace opacity
consistent with multifocal pneumonia, stable. Appearance of the 1
stable compared to 1 day prior. Stable cardiac silhouette.
